# Patient Record
Sex: Female | Born: 1950
Health system: Southern US, Community
[De-identification: ages and names within clinical notes are randomized; demographics above are authoritative.]

## PROBLEM LIST (undated history)

## (undated) DIAGNOSIS — L03317 Cellulitis of buttock: Secondary | ICD-10-CM

## (undated) DIAGNOSIS — M1990 Unspecified osteoarthritis, unspecified site: Secondary | ICD-10-CM

## (undated) DIAGNOSIS — Z853 Personal history of malignant neoplasm of breast: Secondary | ICD-10-CM

## (undated) DIAGNOSIS — N3090 Cystitis, unspecified without hematuria: Secondary | ICD-10-CM

## (undated) DIAGNOSIS — Z860100 Personal history of colon polyps, unspecified: Secondary | ICD-10-CM

## (undated) DIAGNOSIS — H409 Unspecified glaucoma: Secondary | ICD-10-CM

## (undated) DIAGNOSIS — E039 Hypothyroidism, unspecified: Secondary | ICD-10-CM

## (undated) DIAGNOSIS — C50919 Malignant neoplasm of unspecified site of unspecified female breast: Secondary | ICD-10-CM

## (undated) DIAGNOSIS — L0231 Cutaneous abscess of buttock: Secondary | ICD-10-CM

## (undated) DIAGNOSIS — J45909 Unspecified asthma, uncomplicated: Secondary | ICD-10-CM

## (undated) DIAGNOSIS — E119 Type 2 diabetes mellitus without complications: Secondary | ICD-10-CM

## (undated) DIAGNOSIS — J189 Pneumonia, unspecified organism: Secondary | ICD-10-CM

## (undated) DIAGNOSIS — F319 Bipolar disorder, unspecified: Secondary | ICD-10-CM

## (undated) HISTORY — DX: Personal history of malignant neoplasm of breast: Z85.3

## (undated) HISTORY — DX: Personal history of colonic polyps: Z86.010

## (undated) HISTORY — PX: ABDOMINAL HYSTERECTOMY: SHX81

## (undated) HISTORY — DX: Unspecified asthma, uncomplicated: J45.909

## (undated) HISTORY — DX: Cystitis, unspecified without hematuria: N30.90

## (undated) HISTORY — DX: Unspecified glaucoma: H40.9

## (undated) HISTORY — DX: Bipolar disorder, unspecified: F31.9

## (undated) HISTORY — DX: Type 2 diabetes mellitus without complications: E11.9

## (undated) HISTORY — DX: Personal history of colon polyps, unspecified: Z86.0100

## (undated) HISTORY — PX: CHOLECYSTECTOMY: SHX55

## (undated) HISTORY — DX: Cellulitis of buttock: L03.317

## (undated) HISTORY — DX: Unspecified osteoarthritis, unspecified site: M19.90

## (undated) HISTORY — PX: CARDIAC CATHETERIZATION: SHX172

## (undated) HISTORY — DX: Cutaneous abscess of buttock: L02.31

## (undated) HISTORY — DX: Malignant neoplasm of unspecified site of unspecified female breast: C50.919

---

## 1990-12-02 DIAGNOSIS — F319 Bipolar disorder, unspecified: Secondary | ICD-10-CM

## 1990-12-02 HISTORY — DX: Bipolar disorder, unspecified: F31.9

## 2004-10-31 ENCOUNTER — Ambulatory Visit: Payer: Self-pay

## 2005-03-31 ENCOUNTER — Emergency Department: Payer: Self-pay | Admitting: General Practice

## 2005-07-03 ENCOUNTER — Ambulatory Visit: Payer: Self-pay | Admitting: Internal Medicine

## 2005-07-09 ENCOUNTER — Ambulatory Visit: Payer: Self-pay | Admitting: Internal Medicine

## 2005-11-26 ENCOUNTER — Other Ambulatory Visit: Payer: Self-pay

## 2005-11-26 ENCOUNTER — Inpatient Hospital Stay: Payer: Self-pay | Admitting: Psychiatry

## 2006-04-14 ENCOUNTER — Emergency Department: Payer: Self-pay | Admitting: Emergency Medicine

## 2006-07-25 ENCOUNTER — Emergency Department: Payer: Self-pay | Admitting: Emergency Medicine

## 2006-08-18 ENCOUNTER — Ambulatory Visit: Payer: Self-pay | Admitting: Psychiatry

## 2006-08-22 ENCOUNTER — Inpatient Hospital Stay: Payer: Self-pay

## 2006-08-31 ENCOUNTER — Other Ambulatory Visit: Payer: Self-pay

## 2006-09-09 ENCOUNTER — Ambulatory Visit: Payer: Self-pay

## 2006-12-22 ENCOUNTER — Ambulatory Visit: Payer: Self-pay | Admitting: Internal Medicine

## 2006-12-29 ENCOUNTER — Ambulatory Visit: Payer: Self-pay | Admitting: Internal Medicine

## 2007-08-21 ENCOUNTER — Ambulatory Visit: Payer: Self-pay | Admitting: Internal Medicine

## 2007-08-31 ENCOUNTER — Ambulatory Visit: Payer: Self-pay | Admitting: Internal Medicine

## 2008-03-02 ENCOUNTER — Ambulatory Visit: Payer: Self-pay | Admitting: Internal Medicine

## 2008-03-03 ENCOUNTER — Ambulatory Visit: Payer: Self-pay | Admitting: Internal Medicine

## 2008-12-02 DIAGNOSIS — C50919 Malignant neoplasm of unspecified site of unspecified female breast: Secondary | ICD-10-CM

## 2008-12-02 HISTORY — DX: Malignant neoplasm of unspecified site of unspecified female breast: C50.919

## 2008-12-02 HISTORY — PX: BREAST BIOPSY: SHX20

## 2008-12-02 HISTORY — PX: MASTECTOMY: SHX3

## 2009-04-13 ENCOUNTER — Ambulatory Visit: Payer: Self-pay | Admitting: Internal Medicine

## 2009-04-24 ENCOUNTER — Ambulatory Visit: Payer: Self-pay | Admitting: Internal Medicine

## 2009-05-19 ENCOUNTER — Ambulatory Visit: Payer: Self-pay | Admitting: General Surgery

## 2009-05-23 ENCOUNTER — Ambulatory Visit: Payer: Self-pay | Admitting: General Surgery

## 2010-04-16 ENCOUNTER — Ambulatory Visit: Payer: Self-pay | Admitting: General Surgery

## 2011-04-18 ENCOUNTER — Ambulatory Visit: Payer: Self-pay | Admitting: General Surgery

## 2011-05-07 ENCOUNTER — Ambulatory Visit: Payer: Self-pay | Admitting: Anesthesiology

## 2011-05-09 ENCOUNTER — Ambulatory Visit: Payer: Self-pay | Admitting: General Surgery

## 2011-11-28 ENCOUNTER — Emergency Department: Payer: Self-pay | Admitting: Emergency Medicine

## 2011-12-03 DIAGNOSIS — L03317 Cellulitis of buttock: Secondary | ICD-10-CM

## 2011-12-03 DIAGNOSIS — L0231 Cutaneous abscess of buttock: Secondary | ICD-10-CM

## 2011-12-03 HISTORY — DX: Cellulitis of buttock: L03.317

## 2011-12-03 HISTORY — DX: Cutaneous abscess of buttock: L02.31

## 2011-12-03 HISTORY — PX: UPPER GASTROINTESTINAL ENDOSCOPY: SHX188

## 2011-12-03 HISTORY — PX: COLONOSCOPY: SHX174

## 2012-04-20 ENCOUNTER — Ambulatory Visit: Payer: Self-pay | Admitting: General Surgery

## 2012-05-13 ENCOUNTER — Ambulatory Visit: Payer: Self-pay | Admitting: General Surgery

## 2012-05-14 LAB — PATHOLOGY REPORT

## 2013-02-11 ENCOUNTER — Encounter: Payer: Self-pay | Admitting: *Deleted

## 2013-03-10 ENCOUNTER — Encounter: Payer: Self-pay | Admitting: General Surgery

## 2013-04-21 ENCOUNTER — Ambulatory Visit: Payer: Self-pay | Admitting: General Surgery

## 2013-04-21 ENCOUNTER — Encounter: Payer: Self-pay | Admitting: General Surgery

## 2013-05-06 ENCOUNTER — Encounter: Payer: Self-pay | Admitting: General Surgery

## 2013-05-06 ENCOUNTER — Ambulatory Visit (INDEPENDENT_AMBULATORY_CARE_PROVIDER_SITE_OTHER): Payer: Medicare PPO | Admitting: General Surgery

## 2013-05-06 VITALS — BP 120/68 | HR 76 | Resp 14 | Ht 67.0 in | Wt 145.0 lb

## 2013-05-06 DIAGNOSIS — Z853 Personal history of malignant neoplasm of breast: Secondary | ICD-10-CM

## 2013-05-06 NOTE — Patient Instructions (Addendum)
Return in one year . Contine with aromasin.

## 2013-05-06 NOTE — Progress Notes (Addendum)
Patient ID: Jasmine Velasquez, female   DOB: 03-12-1951, 62 y.o.   MRN: [id number]  Chief Complaint  Patient presents with  . Other    mammogram    HPI Jasmine Velasquez is a 62 y.o. female here today for her follow up mammogram done on 04/21/13. Patient dose perform self breast checks and gets regular mammograms.No family history of breast cancer.Patient had a left breast mastectomy in 2010. She is on Aromasin. HPI  Past Medical History  Diagnosis Date  . Cellulitis and abscess of buttock 2013  . Glaucoma     right eye  . Breast CA 2010  . Bipolar 1 disorder 1992  . Asthma   . Arthritis   . Diabetes     non insulin  . Bladder infection   . Personal history of colonic polyps   . Personal history of malignant neoplasm of breast     Past Surgical History  Procedure Laterality Date  . Abdominal hysterectomy  age 28  . Mastectomy Left 2010  . Cholecystectomy    . Colonoscopy  2013  . Upper gastrointestinal endoscopy  2013  . Cardiac catheterization      History reviewed. No pertinent family history.  Social History History  Substance Use Topics  . Smoking status: Current Every Day Smoker -- 0.50 packs/day for 16 years    Types: Cigarettes  . Smokeless tobacco: Never Used  . Alcohol Use: No    Allergies  Allergen Reactions  . Penicillins Rash    Current Outpatient Prescriptions  Medication Sig Dispense Refill  . exemestane (AROMASIN) 25 MG tablet Take 25 mg by mouth daily after breakfast.      . glimepiride (AMARYL) 4 MG tablet Take 4 mg by mouth 3 x daily with food.      . lamoTRIgine (LAMICTAL) 100 MG tablet Take 10 mg by mouth daily.      Marland Kitchen latanoprost (XALATAN) 0.005 % ophthalmic solution       . levothyroxine (SYNTHROID, LEVOTHROID) 100 MCG tablet Take 100 tablets by mouth daily.      . metFORMIN (GLUCOPHAGE) 1000 MG tablet Take 1,000 mg by mouth daily.      . traZODone (DESYREL) 100 MG tablet Take 100 mg by mouth daily.       No current facility-administered  medications for this visit.    Review of Systems Review of Systems  Constitutional: Negative.   Respiratory: Positive for chest tightness. Negative for apnea, cough, choking, shortness of breath, wheezing and stridor.   Cardiovascular: Negative.  Negative for chest pain, palpitations and leg swelling.    Blood pressure 120/68, pulse 76, resp. rate 14, height 5\' 7"  (1.702 m), weight 145 lb (65.772 kg).  Physical Exam Physical Exam  Constitutional: She is oriented to person, place, and time. She appears well-developed and well-nourished.  Eyes: Conjunctivae are normal. No scleral icterus.  Neck: Neck supple.  Cardiovascular: Normal rate, regular rhythm and normal heart sounds.   Pulses:      Dorsalis pedis pulses are 2+ on the right side, and 2+ on the left side.       Posterior tibial pulses are 2+ on the right side, and 2+ on the left side.  No edema or VV  Pulmonary/Chest: Breath sounds normal. Right breast exhibits no inverted nipple, no mass, no nipple discharge, no skin change and no tenderness. Left breast exhibits no inverted nipple, no mass, no nipple discharge, no skin change and no tenderness.  Left mastectomy  well healed  No sign of local recurrentes.  Abdominal: Soft. Bowel sounds are normal. There is no hepatomegaly. There is no tenderness. No hernia.  Lymphadenopathy:    She has no cervical adenopathy.    She has no axillary adenopathy.  Neurological: She is alert and oriented to person, place, and time.  Skin: Skin is warm and dry.    Data Reviewed Mammogram reviewed  Assessment   stable exam Plan   Patient to return in 1 yr with diag right mammogram        SANKAR,SEEPLAPUTHUR G 05/06/2013, 8:52 PM

## 2013-11-03 ENCOUNTER — Telehealth: Payer: Self-pay | Admitting: *Deleted

## 2013-11-03 NOTE — Telephone Encounter (Signed)
Pt called and stated that she had called several months ago about her bill that she received in the mail for 63.28 and was told that she could just disregard the bill and she does not remember who she talked to. Pt wants to know why she would have been told this and does she really have to pay the amount she owes. Thanks!

## 2013-11-04 NOTE — Telephone Encounter (Signed)
Please call patient back and let her know that the balance that she owes went to her deductible for that visit.  She is responsible for the balance.

## 2013-12-11 IMAGING — CR DG CHEST 2V
1 series · 2 of 2 positions shown · non-contrast
Comparison: none

REASON FOR EXAM: cough/SOB
COMMENTS:

[Series 1: w chest pa · 0.14mm/px · 2 of 2 slices shown]
[im 1/2]
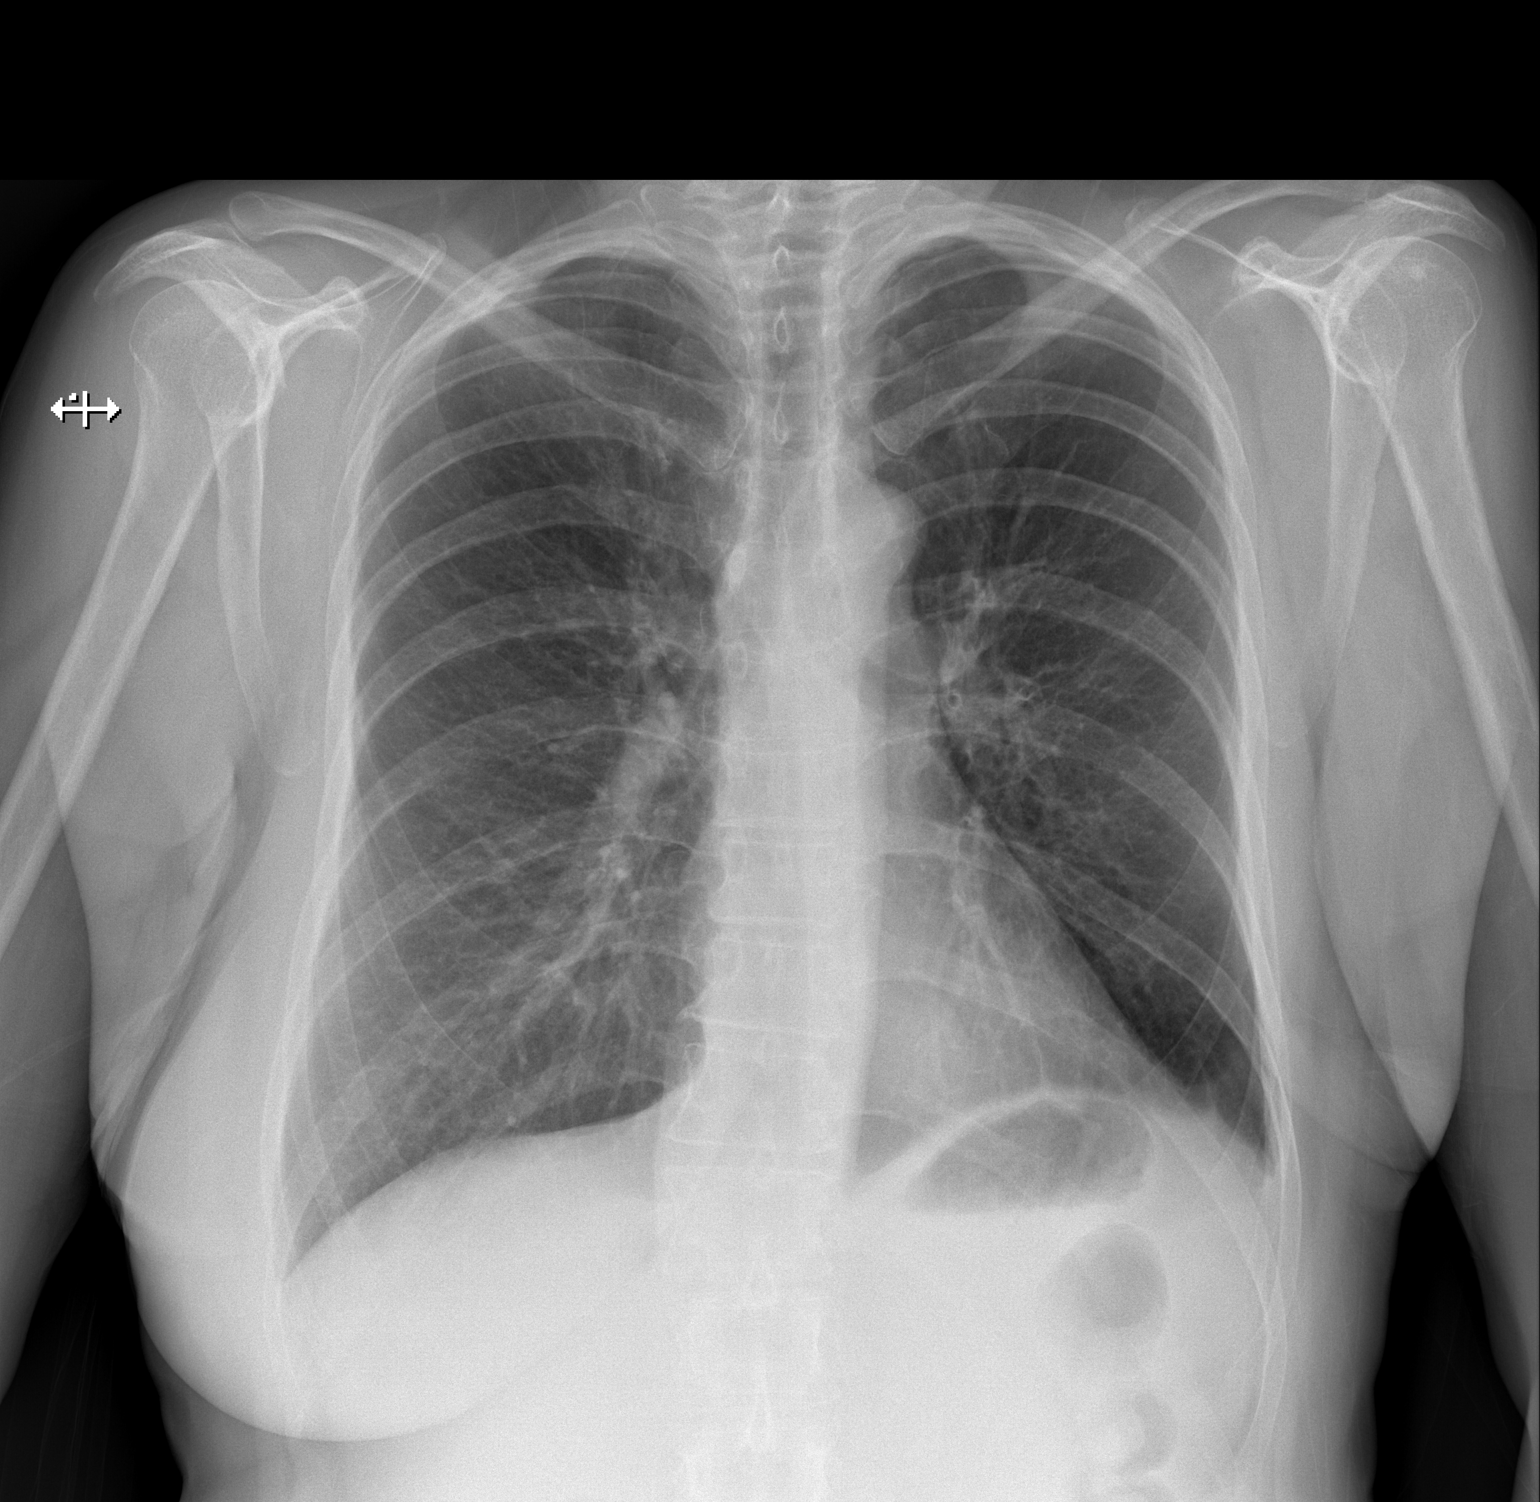
[im 2/2]
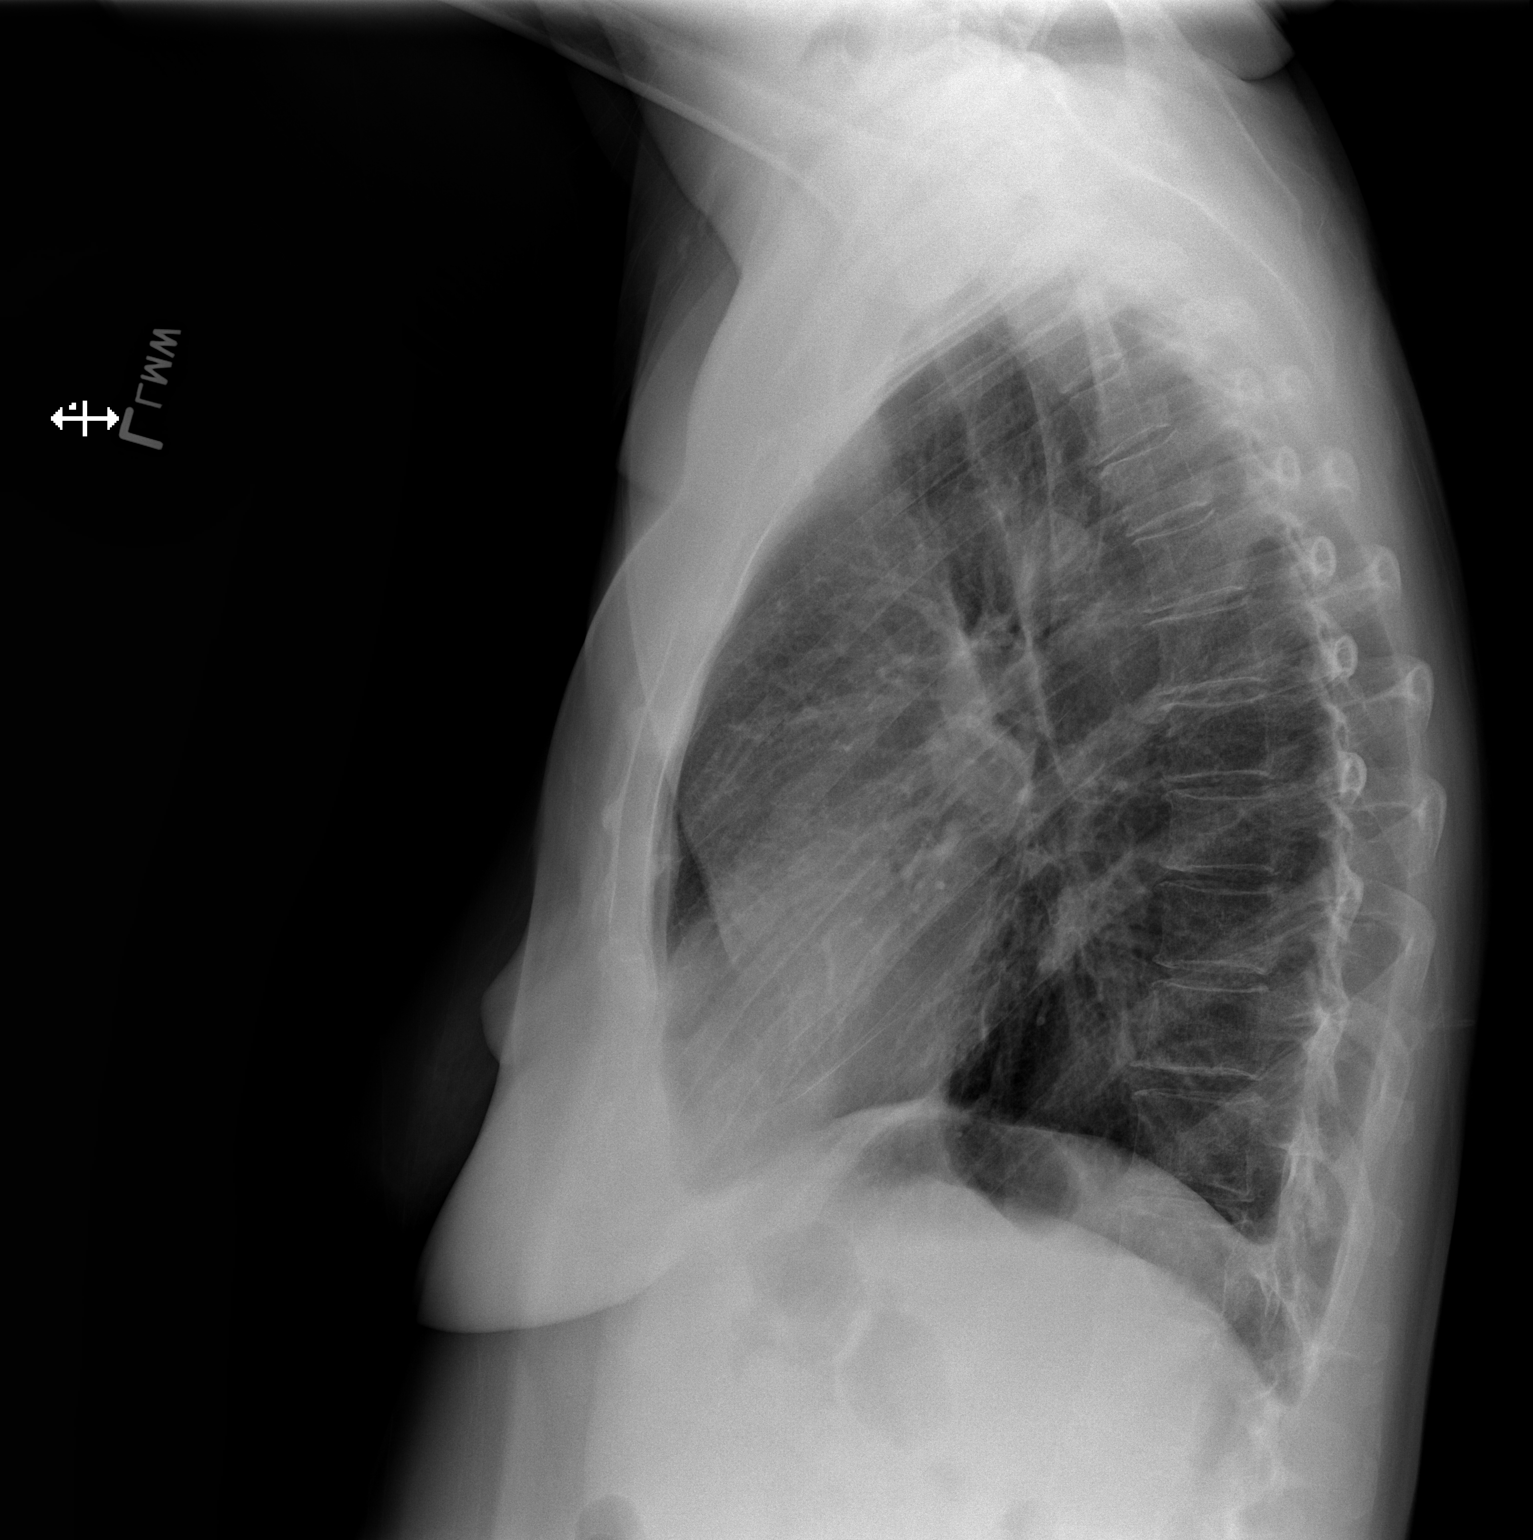

[2 of 2 positions shown; findings below may reference images not displayed]

PROCEDURE:     DXR - DXR CHEST PA (OR AP) AND LATERAL  - November 28, 2011  [DATE]

RESULT:     Comparison is made to study 19 May, 2009.

The lungs are adequately inflated. There is no focal infiltrate. The cardiac
silhouette is normal in size. The pulmonary vascularity is not engorged. I
see no pleural effusion. The mediastinum is normal in width there The bony
thorax exhibits no acute abnormality.
IMPRESSION: I do not see evidence of acute cardiopulmonary abnormality.

## 2014-04-26 ENCOUNTER — Ambulatory Visit: Payer: Self-pay | Admitting: Family Medicine

## 2014-05-16 ENCOUNTER — Ambulatory Visit: Payer: Medicare PPO | Admitting: General Surgery

## 2014-05-30 ENCOUNTER — Ambulatory Visit (INDEPENDENT_AMBULATORY_CARE_PROVIDER_SITE_OTHER): Payer: Medicare PPO | Admitting: General Surgery

## 2014-05-30 ENCOUNTER — Encounter: Payer: Self-pay | Admitting: General Surgery

## 2014-05-30 VITALS — BP 126/74 | HR 76 | Resp 12 | Ht 67.0 in | Wt 138.0 lb

## 2014-05-30 DIAGNOSIS — M533 Sacrococcygeal disorders, not elsewhere classified: Secondary | ICD-10-CM

## 2014-05-30 DIAGNOSIS — Z853 Personal history of malignant neoplasm of breast: Secondary | ICD-10-CM

## 2014-05-30 NOTE — Patient Instructions (Signed)
Patient will be asked to return to the office in one year for a right screening mammogram. Continue self breast exams. Call office for any new breast issues or concerns.

## 2014-05-30 NOTE — Progress Notes (Signed)
Patient ID: Jasmine Velasquez, female   DOB: [birth year], 63 y.o.   MRN: [id number]  Chief Complaint  Patient presents with  . Other    cyst on buttock    HPI Jasmine Velasquez is a 63 y.o. female here today for a cyst on buttocks. Patient states she had one drained in 2013. She states she noticed this about four months ago. She thinks the area is trying to drain some but has not seen any. Painful to sit. .Patient aslo beginning seen today for her follow up right breast mammogram. Patient had a left mastectomy in 2010.  HPI  Past Medical History  Diagnosis Date  . Cellulitis and abscess of buttock 2013  . Glaucoma     right eye  . Breast CA 2010  . Bipolar 1 disorder 1992  . Asthma   . Arthritis   . Diabetes     non insulin  . Bladder infection   . Personal history of colonic polyps   . Personal history of malignant neoplasm of breast     Past Surgical History  Procedure Laterality Date  . Abdominal hysterectomy  age 4  . Mastectomy Left 2010  . Cholecystectomy    . Colonoscopy  2013  . Upper gastrointestinal endoscopy  2013  . Cardiac catheterization      No family history on file.  Social History History  Substance Use Topics  . Smoking status: Current Every Day Smoker -- 0.50 packs/day for 16 years    Types: Cigarettes  . Smokeless tobacco: Never Used  . Alcohol Use: No    Allergies  Allergen Reactions  . Penicillins Rash    Current Outpatient Prescriptions  Medication Sig Dispense Refill  . exemestane (AROMASIN) 25 MG tablet Take 25 mg by mouth daily after breakfast.      . glimepiride (AMARYL) 4 MG tablet Take 4 mg by mouth 3 x daily with food.      Marland Kitchen JANUVIA 50 MG tablet Take 50 mg by mouth daily.       Marland Kitchen latanoprost (XALATAN) 0.005 % ophthalmic solution       . levothyroxine (SYNTHROID, LEVOTHROID) 100 MCG tablet Take 100 tablets by mouth daily.      . metFORMIN (GLUCOPHAGE) 1000 MG tablet Take 1,000 mg by mouth daily.      . traZODone (DESYREL) 100 MG  tablet Take 100 mg by mouth daily.       No current facility-administered medications for this visit.    Review of Systems Review of Systems  Constitutional: Negative.   Respiratory: Negative.   Cardiovascular: Negative.     Blood pressure 126/74, pulse 76, resp. rate 12, height 5\' 7"  (1.702 m), weight 138 lb (62.596 kg).  Physical Exam Physical Exam  Constitutional: She is oriented to person, place, and time. She appears well-developed and well-nourished.  Eyes: Conjunctivae are normal.  Cardiovascular: Normal rate and normal heart sounds.   Pulmonary/Chest: Effort normal and breath sounds normal. Right breast exhibits no inverted nipple, no mass, no nipple discharge, no skin change and no tenderness.  Left mastectomy site well healed.No evidence of local recurrence  Abdominal: Soft. Bowel sounds are normal. There is no tenderness.  Lymphadenopathy:    She has no cervical adenopathy.    She has no axillary adenopathy.  Neurological: She is alert and oriented to person, place, and time.  Skin: Skin is warm and dry.  Tip of coccyx is very prominent and tender, deviated to right side. No skin  abnmormality noted  Data Reviewed Mammogram reviewed   Assessment    Multifocal left breast CA, Stage 1, s/p left mastectomy. Currently on aromasin. Her current symptom of pain in rectal area is from prominent coccyx, whic is deviated to right side Plan    Patient will be asked to return to the office in one year with a right screening mammogram. Recommned use of foam padding when sitting to leesen pain from coccyx.       Gaspar Cola 05/30/2014, 1:43 PM

## 2014-06-01 ENCOUNTER — Ambulatory Visit: Payer: Medicare PPO | Admitting: General Surgery

## 2014-10-03 ENCOUNTER — Encounter: Payer: Self-pay | Admitting: General Surgery

## 2015-04-06 ENCOUNTER — Other Ambulatory Visit: Payer: Self-pay | Admitting: Family Medicine

## 2015-04-06 DIAGNOSIS — Z1231 Encounter for screening mammogram for malignant neoplasm of breast: Secondary | ICD-10-CM

## 2015-04-28 ENCOUNTER — Other Ambulatory Visit: Payer: Self-pay | Admitting: Family Medicine

## 2015-04-28 ENCOUNTER — Ambulatory Visit
Admission: RE | Admit: 2015-04-28 | Discharge: 2015-04-28 | Disposition: A | Discharge status: Home / Self Care | Payer: Medicare PPO | Source: Ambulatory Visit | Attending: Family Medicine | Admitting: Family Medicine

## 2015-04-28 DIAGNOSIS — Z1231 Encounter for screening mammogram for malignant neoplasm of breast: Secondary | ICD-10-CM | POA: Insufficient documentation

## 2015-05-11 ENCOUNTER — Ambulatory Visit: Payer: Medicare PPO | Admitting: General Surgery

## 2015-07-04 ENCOUNTER — Encounter: Payer: Self-pay | Admitting: *Deleted

## 2016-04-18 ENCOUNTER — Other Ambulatory Visit: Payer: Self-pay | Admitting: Family Medicine

## 2016-04-18 DIAGNOSIS — Z1231 Encounter for screening mammogram for malignant neoplasm of breast: Secondary | ICD-10-CM

## 2016-04-30 ENCOUNTER — Other Ambulatory Visit: Payer: Self-pay | Admitting: Family Medicine

## 2016-04-30 ENCOUNTER — Ambulatory Visit
Admission: RE | Admit: 2016-04-30 | Discharge: 2016-04-30 | Disposition: A | Discharge status: Home / Self Care | Payer: Medicare PPO | Source: Ambulatory Visit | Attending: Family Medicine | Admitting: Family Medicine

## 2016-04-30 DIAGNOSIS — Z1231 Encounter for screening mammogram for malignant neoplasm of breast: Secondary | ICD-10-CM | POA: Insufficient documentation

## 2016-04-30 HISTORY — DX: Malignant neoplasm of unspecified site of unspecified female breast: C50.919

## 2017-01-21 DIAGNOSIS — E039 Hypothyroidism, unspecified: Secondary | ICD-10-CM | POA: Diagnosis not present

## 2017-03-20 DIAGNOSIS — E118 Type 2 diabetes mellitus with unspecified complications: Secondary | ICD-10-CM | POA: Diagnosis not present

## 2017-03-20 DIAGNOSIS — E1165 Type 2 diabetes mellitus with hyperglycemia: Secondary | ICD-10-CM | POA: Diagnosis not present

## 2017-03-20 DIAGNOSIS — E7800 Pure hypercholesterolemia, unspecified: Secondary | ICD-10-CM | POA: Diagnosis not present

## 2017-03-27 DIAGNOSIS — E039 Hypothyroidism, unspecified: Secondary | ICD-10-CM | POA: Diagnosis not present

## 2017-03-27 DIAGNOSIS — E1165 Type 2 diabetes mellitus with hyperglycemia: Secondary | ICD-10-CM | POA: Diagnosis not present

## 2017-03-27 DIAGNOSIS — E118 Type 2 diabetes mellitus with unspecified complications: Secondary | ICD-10-CM | POA: Diagnosis not present

## 2017-03-27 DIAGNOSIS — E7800 Pure hypercholesterolemia, unspecified: Secondary | ICD-10-CM | POA: Diagnosis not present

## 2017-03-27 DIAGNOSIS — E538 Deficiency of other specified B group vitamins: Secondary | ICD-10-CM | POA: Diagnosis not present

## 2017-06-20 DIAGNOSIS — E538 Deficiency of other specified B group vitamins: Secondary | ICD-10-CM | POA: Diagnosis not present

## 2017-06-20 DIAGNOSIS — E039 Hypothyroidism, unspecified: Secondary | ICD-10-CM | POA: Diagnosis not present

## 2017-06-20 DIAGNOSIS — E7800 Pure hypercholesterolemia, unspecified: Secondary | ICD-10-CM | POA: Diagnosis not present

## 2017-06-20 DIAGNOSIS — E1165 Type 2 diabetes mellitus with hyperglycemia: Secondary | ICD-10-CM | POA: Diagnosis not present

## 2017-06-20 DIAGNOSIS — E118 Type 2 diabetes mellitus with unspecified complications: Secondary | ICD-10-CM | POA: Diagnosis not present

## 2017-06-25 DIAGNOSIS — F17200 Nicotine dependence, unspecified, uncomplicated: Secondary | ICD-10-CM | POA: Diagnosis not present

## 2017-06-25 DIAGNOSIS — E7800 Pure hypercholesterolemia, unspecified: Secondary | ICD-10-CM | POA: Diagnosis not present

## 2017-06-25 DIAGNOSIS — E119 Type 2 diabetes mellitus without complications: Secondary | ICD-10-CM | POA: Diagnosis not present

## 2017-06-25 DIAGNOSIS — E538 Deficiency of other specified B group vitamins: Secondary | ICD-10-CM | POA: Diagnosis not present

## 2017-06-25 DIAGNOSIS — E039 Hypothyroidism, unspecified: Secondary | ICD-10-CM | POA: Diagnosis not present

## 2017-09-02 DIAGNOSIS — Z23 Encounter for immunization: Secondary | ICD-10-CM | POA: Diagnosis not present

## 2017-10-27 DIAGNOSIS — E119 Type 2 diabetes mellitus without complications: Secondary | ICD-10-CM | POA: Diagnosis not present

## 2017-10-27 DIAGNOSIS — E7800 Pure hypercholesterolemia, unspecified: Secondary | ICD-10-CM | POA: Diagnosis not present

## 2017-10-27 DIAGNOSIS — E039 Hypothyroidism, unspecified: Secondary | ICD-10-CM | POA: Diagnosis not present

## 2017-10-27 DIAGNOSIS — E538 Deficiency of other specified B group vitamins: Secondary | ICD-10-CM | POA: Diagnosis not present

## 2017-10-30 DIAGNOSIS — E538 Deficiency of other specified B group vitamins: Secondary | ICD-10-CM | POA: Diagnosis not present

## 2017-10-30 DIAGNOSIS — E039 Hypothyroidism, unspecified: Secondary | ICD-10-CM | POA: Diagnosis not present

## 2017-10-30 DIAGNOSIS — E119 Type 2 diabetes mellitus without complications: Secondary | ICD-10-CM | POA: Diagnosis not present

## 2017-10-30 DIAGNOSIS — E7800 Pure hypercholesterolemia, unspecified: Secondary | ICD-10-CM | POA: Diagnosis not present

## 2017-10-30 DIAGNOSIS — M545 Low back pain: Secondary | ICD-10-CM | POA: Diagnosis not present

## 2017-10-30 DIAGNOSIS — F17200 Nicotine dependence, unspecified, uncomplicated: Secondary | ICD-10-CM | POA: Diagnosis not present

## 2017-12-03 DIAGNOSIS — Z86010 Personal history of colonic polyps: Secondary | ICD-10-CM | POA: Diagnosis not present

## 2017-12-03 DIAGNOSIS — E538 Deficiency of other specified B group vitamins: Secondary | ICD-10-CM | POA: Diagnosis not present

## 2017-12-03 DIAGNOSIS — E119 Type 2 diabetes mellitus without complications: Secondary | ICD-10-CM | POA: Diagnosis not present

## 2017-12-03 DIAGNOSIS — E039 Hypothyroidism, unspecified: Secondary | ICD-10-CM | POA: Diagnosis not present

## 2017-12-03 DIAGNOSIS — Z0000 Encounter for general adult medical examination without abnormal findings: Secondary | ICD-10-CM | POA: Diagnosis not present

## 2017-12-03 DIAGNOSIS — E7800 Pure hypercholesterolemia, unspecified: Secondary | ICD-10-CM | POA: Diagnosis not present

## 2017-12-05 ENCOUNTER — Other Ambulatory Visit: Payer: Self-pay | Admitting: Family Medicine

## 2017-12-05 DIAGNOSIS — Z1231 Encounter for screening mammogram for malignant neoplasm of breast: Secondary | ICD-10-CM

## 2018-01-06 ENCOUNTER — Ambulatory Visit
Admission: RE | Admit: 2018-01-06 | Discharge: 2018-01-06 | Disposition: A | Discharge status: Home / Self Care | Payer: Medicare HMO | Source: Ambulatory Visit | Attending: Family Medicine | Admitting: Family Medicine

## 2018-01-06 DIAGNOSIS — Z1231 Encounter for screening mammogram for malignant neoplasm of breast: Secondary | ICD-10-CM | POA: Diagnosis not present

## 2018-03-26 DIAGNOSIS — E538 Deficiency of other specified B group vitamins: Secondary | ICD-10-CM | POA: Diagnosis not present

## 2018-03-26 DIAGNOSIS — E7800 Pure hypercholesterolemia, unspecified: Secondary | ICD-10-CM | POA: Diagnosis not present

## 2018-03-26 DIAGNOSIS — E039 Hypothyroidism, unspecified: Secondary | ICD-10-CM | POA: Diagnosis not present

## 2018-03-26 DIAGNOSIS — E119 Type 2 diabetes mellitus without complications: Secondary | ICD-10-CM | POA: Diagnosis not present

## 2018-04-02 DIAGNOSIS — E119 Type 2 diabetes mellitus without complications: Secondary | ICD-10-CM | POA: Diagnosis not present

## 2018-04-02 DIAGNOSIS — E538 Deficiency of other specified B group vitamins: Secondary | ICD-10-CM | POA: Diagnosis not present

## 2018-04-02 DIAGNOSIS — E7800 Pure hypercholesterolemia, unspecified: Secondary | ICD-10-CM | POA: Diagnosis not present

## 2018-04-02 DIAGNOSIS — E039 Hypothyroidism, unspecified: Secondary | ICD-10-CM | POA: Diagnosis not present

## 2018-04-02 DIAGNOSIS — F17200 Nicotine dependence, unspecified, uncomplicated: Secondary | ICD-10-CM | POA: Diagnosis not present

## 2018-04-30 DIAGNOSIS — E538 Deficiency of other specified B group vitamins: Secondary | ICD-10-CM | POA: Diagnosis not present

## 2018-04-30 DIAGNOSIS — Z794 Long term (current) use of insulin: Secondary | ICD-10-CM | POA: Diagnosis not present

## 2018-04-30 DIAGNOSIS — E7800 Pure hypercholesterolemia, unspecified: Secondary | ICD-10-CM | POA: Diagnosis not present

## 2018-04-30 DIAGNOSIS — E039 Hypothyroidism, unspecified: Secondary | ICD-10-CM | POA: Diagnosis not present

## 2018-04-30 DIAGNOSIS — E119 Type 2 diabetes mellitus without complications: Secondary | ICD-10-CM | POA: Diagnosis not present

## 2018-05-13 DIAGNOSIS — E1165 Type 2 diabetes mellitus with hyperglycemia: Secondary | ICD-10-CM

## 2018-08-24 DIAGNOSIS — E119 Type 2 diabetes mellitus without complications: Secondary | ICD-10-CM | POA: Diagnosis not present

## 2018-08-24 DIAGNOSIS — Z794 Long term (current) use of insulin: Secondary | ICD-10-CM | POA: Diagnosis not present

## 2018-08-24 DIAGNOSIS — E039 Hypothyroidism, unspecified: Secondary | ICD-10-CM | POA: Diagnosis not present

## 2018-08-24 DIAGNOSIS — E538 Deficiency of other specified B group vitamins: Secondary | ICD-10-CM | POA: Diagnosis not present

## 2018-08-24 DIAGNOSIS — E7800 Pure hypercholesterolemia, unspecified: Secondary | ICD-10-CM | POA: Diagnosis not present

## 2018-09-15 DIAGNOSIS — E1169 Type 2 diabetes mellitus with other specified complication: Secondary | ICD-10-CM | POA: Diagnosis not present

## 2018-09-15 DIAGNOSIS — F330 Major depressive disorder, recurrent, mild: Secondary | ICD-10-CM | POA: Diagnosis not present

## 2018-09-15 DIAGNOSIS — F17200 Nicotine dependence, unspecified, uncomplicated: Secondary | ICD-10-CM | POA: Diagnosis not present

## 2018-09-15 DIAGNOSIS — E7800 Pure hypercholesterolemia, unspecified: Secondary | ICD-10-CM | POA: Diagnosis not present

## 2018-09-15 DIAGNOSIS — E119 Type 2 diabetes mellitus without complications: Secondary | ICD-10-CM | POA: Diagnosis not present

## 2018-09-15 DIAGNOSIS — E1165 Type 2 diabetes mellitus with hyperglycemia: Secondary | ICD-10-CM | POA: Diagnosis not present

## 2018-09-15 DIAGNOSIS — E538 Deficiency of other specified B group vitamins: Secondary | ICD-10-CM | POA: Diagnosis not present

## 2018-09-15 DIAGNOSIS — Z23 Encounter for immunization: Secondary | ICD-10-CM | POA: Diagnosis not present

## 2018-09-15 DIAGNOSIS — E039 Hypothyroidism, unspecified: Secondary | ICD-10-CM | POA: Diagnosis not present

## 2019-01-12 DIAGNOSIS — Z794 Long term (current) use of insulin: Secondary | ICD-10-CM | POA: Diagnosis not present

## 2019-01-12 DIAGNOSIS — E7800 Pure hypercholesterolemia, unspecified: Secondary | ICD-10-CM | POA: Diagnosis not present

## 2019-01-12 DIAGNOSIS — E119 Type 2 diabetes mellitus without complications: Secondary | ICD-10-CM | POA: Diagnosis not present

## 2019-01-12 DIAGNOSIS — E039 Hypothyroidism, unspecified: Secondary | ICD-10-CM | POA: Diagnosis not present

## 2019-01-18 DIAGNOSIS — F17200 Nicotine dependence, unspecified, uncomplicated: Secondary | ICD-10-CM | POA: Diagnosis not present

## 2019-01-18 DIAGNOSIS — Z794 Long term (current) use of insulin: Secondary | ICD-10-CM | POA: Diagnosis not present

## 2019-01-18 DIAGNOSIS — E1169 Type 2 diabetes mellitus with other specified complication: Secondary | ICD-10-CM | POA: Diagnosis not present

## 2019-01-18 DIAGNOSIS — Z0000 Encounter for general adult medical examination without abnormal findings: Secondary | ICD-10-CM | POA: Diagnosis not present

## 2019-01-18 DIAGNOSIS — E785 Hyperlipidemia, unspecified: Secondary | ICD-10-CM | POA: Diagnosis not present

## 2019-01-18 DIAGNOSIS — E039 Hypothyroidism, unspecified: Secondary | ICD-10-CM | POA: Diagnosis not present

## 2019-01-18 DIAGNOSIS — R55 Syncope and collapse: Secondary | ICD-10-CM | POA: Diagnosis not present

## 2019-01-18 DIAGNOSIS — E7800 Pure hypercholesterolemia, unspecified: Secondary | ICD-10-CM | POA: Diagnosis not present

## 2019-01-18 DIAGNOSIS — E119 Type 2 diabetes mellitus without complications: Secondary | ICD-10-CM | POA: Diagnosis not present

## 2019-01-22 ENCOUNTER — Other Ambulatory Visit: Payer: Self-pay | Admitting: Family Medicine

## 2019-01-22 ENCOUNTER — Telehealth (HOSPITAL_COMMUNITY): Payer: Self-pay

## 2019-01-22 ENCOUNTER — Other Ambulatory Visit (HOSPITAL_COMMUNITY): Payer: Self-pay | Admitting: Family Medicine

## 2019-01-22 DIAGNOSIS — R55 Syncope and collapse: Secondary | ICD-10-CM

## 2019-02-15 DIAGNOSIS — R55 Syncope and collapse: Secondary | ICD-10-CM | POA: Diagnosis not present

## 2019-08-24 DIAGNOSIS — E7800 Pure hypercholesterolemia, unspecified: Secondary | ICD-10-CM | POA: Diagnosis not present

## 2019-08-24 DIAGNOSIS — E039 Hypothyroidism, unspecified: Secondary | ICD-10-CM | POA: Diagnosis not present

## 2019-08-24 DIAGNOSIS — F17200 Nicotine dependence, unspecified, uncomplicated: Secondary | ICD-10-CM | POA: Diagnosis not present

## 2019-08-24 DIAGNOSIS — E538 Deficiency of other specified B group vitamins: Secondary | ICD-10-CM | POA: Diagnosis not present

## 2019-08-31 DIAGNOSIS — E039 Hypothyroidism, unspecified: Secondary | ICD-10-CM | POA: Diagnosis not present

## 2019-08-31 DIAGNOSIS — F17210 Nicotine dependence, cigarettes, uncomplicated: Secondary | ICD-10-CM | POA: Diagnosis not present

## 2019-08-31 DIAGNOSIS — Z794 Long term (current) use of insulin: Secondary | ICD-10-CM | POA: Diagnosis not present

## 2019-08-31 DIAGNOSIS — F329 Major depressive disorder, single episode, unspecified: Secondary | ICD-10-CM | POA: Diagnosis not present

## 2019-08-31 DIAGNOSIS — E7800 Pure hypercholesterolemia, unspecified: Secondary | ICD-10-CM | POA: Diagnosis not present

## 2019-08-31 DIAGNOSIS — E1165 Type 2 diabetes mellitus with hyperglycemia: Secondary | ICD-10-CM | POA: Diagnosis not present

## 2019-08-31 DIAGNOSIS — E538 Deficiency of other specified B group vitamins: Secondary | ICD-10-CM | POA: Diagnosis not present

## 2019-09-25 DIAGNOSIS — Z23 Encounter for immunization: Secondary | ICD-10-CM | POA: Diagnosis not present

## 2019-12-23 DIAGNOSIS — E1169 Type 2 diabetes mellitus with other specified complication: Secondary | ICD-10-CM | POA: Diagnosis not present

## 2019-12-23 DIAGNOSIS — E785 Hyperlipidemia, unspecified: Secondary | ICD-10-CM | POA: Diagnosis not present

## 2019-12-23 DIAGNOSIS — E538 Deficiency of other specified B group vitamins: Secondary | ICD-10-CM | POA: Diagnosis not present

## 2019-12-23 DIAGNOSIS — E7800 Pure hypercholesterolemia, unspecified: Secondary | ICD-10-CM | POA: Diagnosis not present

## 2019-12-23 DIAGNOSIS — E039 Hypothyroidism, unspecified: Secondary | ICD-10-CM | POA: Diagnosis not present

## 2020-01-04 DIAGNOSIS — M549 Dorsalgia, unspecified: Secondary | ICD-10-CM | POA: Diagnosis not present

## 2020-01-04 DIAGNOSIS — E538 Deficiency of other specified B group vitamins: Secondary | ICD-10-CM | POA: Diagnosis not present

## 2020-01-04 DIAGNOSIS — E119 Type 2 diabetes mellitus without complications: Secondary | ICD-10-CM | POA: Diagnosis not present

## 2020-01-04 DIAGNOSIS — E7800 Pure hypercholesterolemia, unspecified: Secondary | ICD-10-CM | POA: Diagnosis not present

## 2020-01-04 DIAGNOSIS — F17210 Nicotine dependence, cigarettes, uncomplicated: Secondary | ICD-10-CM | POA: Diagnosis not present

## 2020-01-04 DIAGNOSIS — E039 Hypothyroidism, unspecified: Secondary | ICD-10-CM | POA: Diagnosis not present

## 2020-01-04 DIAGNOSIS — F330 Major depressive disorder, recurrent, mild: Secondary | ICD-10-CM | POA: Diagnosis not present

## 2020-01-04 DIAGNOSIS — Z794 Long term (current) use of insulin: Secondary | ICD-10-CM | POA: Diagnosis not present

## 2020-02-17 DIAGNOSIS — E785 Hyperlipidemia, unspecified: Secondary | ICD-10-CM | POA: Diagnosis not present

## 2020-02-17 DIAGNOSIS — Z794 Long term (current) use of insulin: Secondary | ICD-10-CM | POA: Diagnosis not present

## 2020-02-17 DIAGNOSIS — E1169 Type 2 diabetes mellitus with other specified complication: Secondary | ICD-10-CM | POA: Diagnosis not present

## 2020-02-17 DIAGNOSIS — Z720 Tobacco use: Secondary | ICD-10-CM | POA: Diagnosis not present

## 2020-02-17 DIAGNOSIS — E1165 Type 2 diabetes mellitus with hyperglycemia: Secondary | ICD-10-CM | POA: Diagnosis not present

## 2020-05-04 DIAGNOSIS — E119 Type 2 diabetes mellitus without complications: Secondary | ICD-10-CM | POA: Diagnosis not present

## 2020-05-04 DIAGNOSIS — E7800 Pure hypercholesterolemia, unspecified: Secondary | ICD-10-CM | POA: Diagnosis not present

## 2020-05-04 DIAGNOSIS — Z794 Long term (current) use of insulin: Secondary | ICD-10-CM | POA: Diagnosis not present

## 2020-05-04 DIAGNOSIS — E039 Hypothyroidism, unspecified: Secondary | ICD-10-CM | POA: Diagnosis not present

## 2020-12-14 DIAGNOSIS — N898 Other specified noninflammatory disorders of vagina: Secondary | ICD-10-CM | POA: Diagnosis not present

## 2020-12-14 DIAGNOSIS — N763 Subacute and chronic vulvitis: Secondary | ICD-10-CM | POA: Diagnosis not present

## 2020-12-14 DIAGNOSIS — B373 Candidiasis of vulva and vagina: Secondary | ICD-10-CM | POA: Diagnosis not present

## 2021-01-03 DIAGNOSIS — F17200 Nicotine dependence, unspecified, uncomplicated: Secondary | ICD-10-CM | POA: Diagnosis not present

## 2021-01-03 DIAGNOSIS — E1169 Type 2 diabetes mellitus with other specified complication: Secondary | ICD-10-CM | POA: Diagnosis not present

## 2021-01-03 DIAGNOSIS — E1165 Type 2 diabetes mellitus with hyperglycemia: Secondary | ICD-10-CM | POA: Diagnosis not present

## 2021-01-03 DIAGNOSIS — E785 Hyperlipidemia, unspecified: Secondary | ICD-10-CM | POA: Diagnosis not present

## 2021-01-03 DIAGNOSIS — Z794 Long term (current) use of insulin: Secondary | ICD-10-CM | POA: Diagnosis not present

## 2021-01-03 DIAGNOSIS — F330 Major depressive disorder, recurrent, mild: Secondary | ICD-10-CM | POA: Diagnosis not present

## 2021-01-03 DIAGNOSIS — E538 Deficiency of other specified B group vitamins: Secondary | ICD-10-CM | POA: Diagnosis not present

## 2021-01-03 DIAGNOSIS — E7800 Pure hypercholesterolemia, unspecified: Secondary | ICD-10-CM | POA: Diagnosis not present

## 2021-01-03 DIAGNOSIS — E039 Hypothyroidism, unspecified: Secondary | ICD-10-CM | POA: Diagnosis not present

## 2021-03-12 ENCOUNTER — Other Ambulatory Visit: Payer: Self-pay

## 2021-03-12 ENCOUNTER — Emergency Department: Payer: Medicare HMO

## 2021-03-12 ENCOUNTER — Encounter: Payer: Self-pay | Admitting: Emergency Medicine

## 2021-03-12 ENCOUNTER — Emergency Department
Admission: EM | Admit: 2021-03-12 | Discharge: 2021-03-12 | Disposition: A | Discharge status: Home / Self Care | Payer: Medicare HMO | Attending: Emergency Medicine | Admitting: Emergency Medicine

## 2021-03-12 DIAGNOSIS — Z7984 Long term (current) use of oral hypoglycemic drugs: Secondary | ICD-10-CM | POA: Insufficient documentation

## 2021-03-12 DIAGNOSIS — M7989 Other specified soft tissue disorders: Secondary | ICD-10-CM | POA: Diagnosis not present

## 2021-03-12 DIAGNOSIS — J45909 Unspecified asthma, uncomplicated: Secondary | ICD-10-CM | POA: Diagnosis not present

## 2021-03-12 DIAGNOSIS — Z853 Personal history of malignant neoplasm of breast: Secondary | ICD-10-CM | POA: Insufficient documentation

## 2021-03-12 DIAGNOSIS — S6991XA Unspecified injury of right wrist, hand and finger(s), initial encounter: Secondary | ICD-10-CM | POA: Diagnosis present

## 2021-03-12 DIAGNOSIS — E119 Type 2 diabetes mellitus without complications: Secondary | ICD-10-CM | POA: Insufficient documentation

## 2021-03-12 DIAGNOSIS — F17210 Nicotine dependence, cigarettes, uncomplicated: Secondary | ICD-10-CM | POA: Insufficient documentation

## 2021-03-12 DIAGNOSIS — W010XXA Fall on same level from slipping, tripping and stumbling without subsequent striking against object, initial encounter: Secondary | ICD-10-CM | POA: Diagnosis not present

## 2021-03-12 DIAGNOSIS — S62656A Nondisplaced fracture of medial phalanx of right little finger, initial encounter for closed fracture: Secondary | ICD-10-CM | POA: Diagnosis not present

## 2021-03-12 DIAGNOSIS — S62626A Displaced fracture of medial phalanx of right little finger, initial encounter for closed fracture: Secondary | ICD-10-CM | POA: Diagnosis not present

## 2021-03-12 NOTE — ED Provider Notes (Signed)
[facility] Emergency Department Provider Note   ____________________________________________   Event Date/Time   First MD Initiated Contact with Patient 03/12/21 1100     (approximate)  I have reviewed the triage vital signs and the nursing notes.   HISTORY  Chief Complaint Hand Injury    HPI Jasmine Velasquez is a 70 y.o. female with past medical history of bipolar disorder, diabetes, and asthma who presents to the ED complaining of hand injury.  Patient reports that she tripped and fell yesterday evening, falling onto her outstretched right hand.  She denies hitting her head or losing consciousness, does not take any blood thinners.  She has had increasing pain and swelling to her right fifth digit of her hand.  She has a ring at the base of this finger that she has been unable to remove.  She denies significant pain at her elbow or wrist, has not had any injury to her other extremities.        Past Medical History:  Diagnosis Date  . Arthritis   . Asthma   . Bipolar 1 disorder ([place]) 1992  . Bladder infection   . Breast CA ([place]) 2010  . Breast cancer ([place]) 2010   left breast mastectomy NO chemo or rad  . Cellulitis and abscess of buttock 2013  . Diabetes ([place])    non insulin  . Glaucoma    right eye  . Personal history of colonic polyps   . Personal history of malignant neoplasm of breast     Patient Active Problem List   Diagnosis Date Noted  . Personal history of malignant neoplasm of breast 05/06/2013    Past Surgical History:  Procedure Laterality Date  . ABDOMINAL HYSTERECTOMY  age 66  . BREAST BIOPSY Right 2010   core bx- neg  . CARDIAC CATHETERIZATION    . CHOLECYSTECTOMY    . COLONOSCOPY  2013  . MASTECTOMY Left 2010  . UPPER GASTROINTESTINAL ENDOSCOPY  2013    Prior to Admission medications   Medication Sig Start Date End Date Taking? Authorizing Provider  exemestane (AROMASIN) 25 MG tablet Take 25 mg by mouth daily  after breakfast.    [provider]  glimepiride (AMARYL) 4 MG tablet Take 4 mg by mouth 3 x daily with food. 03/18/13   [provider]  JANUVIA 50 MG tablet Take 50 mg by mouth daily.  05/27/14   [provider]  latanoprost (XALATAN) 0.005 % ophthalmic solution  02/04/13   [provider]  levothyroxine (SYNTHROID, LEVOTHROID) 100 MCG tablet Take 100 tablets by mouth daily. 03/17/13   [provider]  metFORMIN (GLUCOPHAGE) 1000 MG tablet Take 1,000 mg by mouth daily. 03/18/13   [provider]  traZODone (DESYREL) 100 MG tablet Take 100 mg by mouth daily. 03/17/13   [provider]    Allergies Penicillins  Family History  Problem Relation Age of Onset  . Ovarian cancer Maternal Grandmother 42    Social History Social History   Tobacco Use  . Smoking status: Current Every Day Smoker    Packs/day: 0.50    Years: 16.00    Pack years: 8.00    Types: Cigarettes  . Smokeless tobacco: Never Used  Substance Use Topics  . Alcohol use: No  . Drug use: No    Review of Systems  Constitutional: No fever/chills Eyes: No visual changes. ENT: No sore throat. Cardiovascular: Denies chest pain. Respiratory: Denies shortness of breath. Gastrointestinal: No abdominal  pain.  No nausea, no vomiting.  No diarrhea.  No constipation. Genitourinary: Negative for dysuria. Musculoskeletal: Negative for back pain.  Positive for pain and swelling of right pinky finger. Skin: Negative for rash. Neurological: Negative for headaches, focal weakness or numbness.  ____________________________________________   PHYSICAL EXAM:  VITAL SIGNS: ED Triage Vitals [03/12/21 1050]  Enc Vitals Group     BP 121/79     Pulse Rate 87     Resp 20     Temp 98.1 F (36.7 C)     Temp Source Oral     SpO2 98 %     Weight      Height      Head Circumference      Peak Flow      Pain Score      Pain Loc      Pain Edu?      Excl. in [place]?      Constitutional: Alert and oriented. Eyes: Conjunctivae are normal. Head: Atraumatic. Nose: No congestion/rhinnorhea. Mouth/Throat: Mucous membranes are moist. Neck: Normal ROM Cardiovascular: Normal rate, regular rhythm. Grossly normal heart sounds. Respiratory: Normal respiratory effort.  No retractions. Lungs CTAB. Gastrointestinal: Soft and nontender. No distention. Genitourinary: deferred Musculoskeletal: No lower extremity tenderness nor edema.  Edema and ecchymosis to right fifth digit diffusely with associated tenderness.  Small ring in place at base of right pinky finger with significant distal edema. Neurologic:  Normal speech and language. No gross focal neurologic deficits are appreciated. Skin:  Skin is warm, dry and intact. No rash noted. Psychiatric: Mood and affect are normal. Speech and behavior are normal.  ____________________________________________   LABS (all labs ordered are listed, but only abnormal results are displayed)  Labs Reviewed - No data to display   PROCEDURES  Procedure(s) performed (including Critical Care):  Procedures   ____________________________________________   INITIAL IMPRESSION / ASSESSMENT AND PLAN / ED COURSE       70 year old female with past medical history of bipolar disorder, diabetes, and asthma who presents to the ED with pain and swelling of right pinky finger after a fall yesterday.  She did have a ring stuck in place at the base of her finger which was removed with ring cutter.  Patient had significant improvement in pain following removal of ring.  We will further assess with x-ray for any fracture or dislocation.  Hand x-rays were reviewed by me and show small nondisplaced fracture of base of fifth middle phalanx.  Her small finger was buddy taped to the ring finger and she is appropriate for discharge home with orthopedic follow-up as needed.  She was counseled to return to the ED for new worsening symptoms,  patient agrees with plan.      ____________________________________________   FINAL CLINICAL IMPRESSION(S) / ED DIAGNOSES  Final diagnoses:  Closed nondisplaced fracture of middle phalanx of right little finger, initial encounter     ED Discharge Orders    None       Note:  This document was prepared using Dragon voice recognition software and may include unintentional dictation errors.   Blake Divine, MD 03/12/21 1236

## 2021-03-12 NOTE — ED Triage Notes (Signed)
States she fell yesterday  Injury to right 5 th finger   5th finger in swollen and bruised

## 2021-03-23 DIAGNOSIS — Z794 Long term (current) use of insulin: Secondary | ICD-10-CM | POA: Diagnosis not present

## 2021-03-23 DIAGNOSIS — E119 Type 2 diabetes mellitus without complications: Secondary | ICD-10-CM | POA: Diagnosis not present

## 2021-03-23 DIAGNOSIS — F17200 Nicotine dependence, unspecified, uncomplicated: Secondary | ICD-10-CM | POA: Diagnosis not present

## 2021-03-23 DIAGNOSIS — E039 Hypothyroidism, unspecified: Secondary | ICD-10-CM | POA: Diagnosis not present

## 2021-03-23 DIAGNOSIS — E538 Deficiency of other specified B group vitamins: Secondary | ICD-10-CM | POA: Diagnosis not present

## 2021-03-23 DIAGNOSIS — F330 Major depressive disorder, recurrent, mild: Secondary | ICD-10-CM | POA: Diagnosis not present

## 2021-03-23 DIAGNOSIS — E7800 Pure hypercholesterolemia, unspecified: Secondary | ICD-10-CM | POA: Diagnosis not present

## 2021-03-23 DIAGNOSIS — F17218 Nicotine dependence, cigarettes, with other nicotine-induced disorders: Secondary | ICD-10-CM | POA: Diagnosis not present

## 2021-07-23 DIAGNOSIS — E7800 Pure hypercholesterolemia, unspecified: Secondary | ICD-10-CM | POA: Diagnosis not present

## 2021-07-23 DIAGNOSIS — E039 Hypothyroidism, unspecified: Secondary | ICD-10-CM | POA: Diagnosis not present

## 2021-07-23 DIAGNOSIS — E538 Deficiency of other specified B group vitamins: Secondary | ICD-10-CM | POA: Diagnosis not present

## 2021-07-23 DIAGNOSIS — E119 Type 2 diabetes mellitus without complications: Secondary | ICD-10-CM | POA: Diagnosis not present

## 2021-07-23 DIAGNOSIS — Z794 Long term (current) use of insulin: Secondary | ICD-10-CM | POA: Diagnosis not present

## 2021-07-25 DIAGNOSIS — E039 Hypothyroidism, unspecified: Secondary | ICD-10-CM | POA: Diagnosis not present

## 2021-07-25 DIAGNOSIS — E785 Hyperlipidemia, unspecified: Secondary | ICD-10-CM | POA: Diagnosis not present

## 2021-07-25 DIAGNOSIS — Z794 Long term (current) use of insulin: Secondary | ICD-10-CM | POA: Diagnosis not present

## 2021-07-25 DIAGNOSIS — F17200 Nicotine dependence, unspecified, uncomplicated: Secondary | ICD-10-CM | POA: Diagnosis not present

## 2021-07-25 DIAGNOSIS — Z0000 Encounter for general adult medical examination without abnormal findings: Secondary | ICD-10-CM | POA: Diagnosis not present

## 2021-07-25 DIAGNOSIS — E119 Type 2 diabetes mellitus without complications: Secondary | ICD-10-CM | POA: Diagnosis not present

## 2021-10-10 DIAGNOSIS — E7800 Pure hypercholesterolemia, unspecified: Secondary | ICD-10-CM | POA: Diagnosis not present

## 2021-10-10 DIAGNOSIS — Z794 Long term (current) use of insulin: Secondary | ICD-10-CM | POA: Diagnosis not present

## 2021-10-10 DIAGNOSIS — E119 Type 2 diabetes mellitus without complications: Secondary | ICD-10-CM | POA: Diagnosis not present

## 2021-10-10 DIAGNOSIS — E039 Hypothyroidism, unspecified: Secondary | ICD-10-CM | POA: Diagnosis not present

## 2021-10-10 DIAGNOSIS — E1169 Type 2 diabetes mellitus with other specified complication: Secondary | ICD-10-CM | POA: Diagnosis not present

## 2021-10-10 DIAGNOSIS — E785 Hyperlipidemia, unspecified: Secondary | ICD-10-CM | POA: Diagnosis not present

## 2021-10-17 DIAGNOSIS — Z23 Encounter for immunization: Secondary | ICD-10-CM | POA: Diagnosis not present

## 2021-10-17 DIAGNOSIS — E119 Type 2 diabetes mellitus without complications: Secondary | ICD-10-CM | POA: Diagnosis not present

## 2021-10-17 DIAGNOSIS — Z794 Long term (current) use of insulin: Secondary | ICD-10-CM | POA: Diagnosis not present

## 2021-10-17 DIAGNOSIS — F17200 Nicotine dependence, unspecified, uncomplicated: Secondary | ICD-10-CM | POA: Diagnosis not present

## 2021-10-17 DIAGNOSIS — E039 Hypothyroidism, unspecified: Secondary | ICD-10-CM | POA: Diagnosis not present

## 2021-10-17 DIAGNOSIS — E7800 Pure hypercholesterolemia, unspecified: Secondary | ICD-10-CM | POA: Diagnosis not present

## 2022-02-08 DIAGNOSIS — E7800 Pure hypercholesterolemia, unspecified: Secondary | ICD-10-CM | POA: Diagnosis not present

## 2022-02-08 DIAGNOSIS — E119 Type 2 diabetes mellitus without complications: Secondary | ICD-10-CM | POA: Diagnosis not present

## 2022-02-08 DIAGNOSIS — E039 Hypothyroidism, unspecified: Secondary | ICD-10-CM | POA: Diagnosis not present

## 2022-02-08 DIAGNOSIS — Z794 Long term (current) use of insulin: Secondary | ICD-10-CM | POA: Diagnosis not present

## 2022-02-12 DIAGNOSIS — E039 Hypothyroidism, unspecified: Secondary | ICD-10-CM | POA: Diagnosis not present

## 2022-02-12 DIAGNOSIS — Z87891 Personal history of nicotine dependence: Secondary | ICD-10-CM | POA: Diagnosis not present

## 2022-02-12 DIAGNOSIS — E119 Type 2 diabetes mellitus without complications: Secondary | ICD-10-CM | POA: Diagnosis not present

## 2022-02-12 DIAGNOSIS — Z794 Long term (current) use of insulin: Secondary | ICD-10-CM | POA: Diagnosis not present

## 2022-02-12 DIAGNOSIS — E7800 Pure hypercholesterolemia, unspecified: Secondary | ICD-10-CM | POA: Diagnosis not present

## 2022-07-16 DIAGNOSIS — Z794 Long term (current) use of insulin: Secondary | ICD-10-CM | POA: Diagnosis not present

## 2022-07-16 DIAGNOSIS — E039 Hypothyroidism, unspecified: Secondary | ICD-10-CM | POA: Diagnosis not present

## 2022-07-16 DIAGNOSIS — E785 Hyperlipidemia, unspecified: Secondary | ICD-10-CM | POA: Diagnosis not present

## 2022-07-16 DIAGNOSIS — E7800 Pure hypercholesterolemia, unspecified: Secondary | ICD-10-CM | POA: Diagnosis not present

## 2022-07-16 DIAGNOSIS — E1169 Type 2 diabetes mellitus with other specified complication: Secondary | ICD-10-CM | POA: Diagnosis not present

## 2022-07-16 DIAGNOSIS — E119 Type 2 diabetes mellitus without complications: Secondary | ICD-10-CM | POA: Diagnosis not present

## 2022-07-16 DIAGNOSIS — E538 Deficiency of other specified B group vitamins: Secondary | ICD-10-CM | POA: Diagnosis not present

## 2022-07-16 DIAGNOSIS — E1165 Type 2 diabetes mellitus with hyperglycemia: Secondary | ICD-10-CM | POA: Diagnosis not present

## 2022-07-29 DIAGNOSIS — E119 Type 2 diabetes mellitus without complications: Secondary | ICD-10-CM | POA: Diagnosis not present

## 2022-07-29 DIAGNOSIS — E7800 Pure hypercholesterolemia, unspecified: Secondary | ICD-10-CM | POA: Diagnosis not present

## 2022-07-29 DIAGNOSIS — Z794 Long term (current) use of insulin: Secondary | ICD-10-CM | POA: Diagnosis not present

## 2022-07-29 DIAGNOSIS — E039 Hypothyroidism, unspecified: Secondary | ICD-10-CM | POA: Diagnosis not present

## 2022-07-29 DIAGNOSIS — F330 Major depressive disorder, recurrent, mild: Secondary | ICD-10-CM | POA: Diagnosis not present

## 2022-09-12 DIAGNOSIS — E039 Hypothyroidism, unspecified: Secondary | ICD-10-CM | POA: Diagnosis not present

## 2022-09-12 DIAGNOSIS — E119 Type 2 diabetes mellitus without complications: Secondary | ICD-10-CM | POA: Diagnosis not present

## 2022-09-12 DIAGNOSIS — Z23 Encounter for immunization: Secondary | ICD-10-CM | POA: Diagnosis not present

## 2022-09-12 DIAGNOSIS — Z794 Long term (current) use of insulin: Secondary | ICD-10-CM | POA: Diagnosis not present

## 2022-09-12 DIAGNOSIS — F330 Major depressive disorder, recurrent, mild: Secondary | ICD-10-CM | POA: Diagnosis not present

## 2022-09-12 DIAGNOSIS — E7800 Pure hypercholesterolemia, unspecified: Secondary | ICD-10-CM | POA: Diagnosis not present

## 2022-10-04 DIAGNOSIS — E039 Hypothyroidism, unspecified: Secondary | ICD-10-CM | POA: Diagnosis not present

## 2022-12-03 DIAGNOSIS — E039 Hypothyroidism, unspecified: Secondary | ICD-10-CM | POA: Diagnosis not present

## 2022-12-19 DIAGNOSIS — E119 Type 2 diabetes mellitus without complications: Secondary | ICD-10-CM | POA: Diagnosis not present

## 2022-12-19 DIAGNOSIS — F330 Major depressive disorder, recurrent, mild: Secondary | ICD-10-CM | POA: Diagnosis not present

## 2022-12-19 DIAGNOSIS — Z794 Long term (current) use of insulin: Secondary | ICD-10-CM | POA: Diagnosis not present

## 2022-12-19 DIAGNOSIS — E7800 Pure hypercholesterolemia, unspecified: Secondary | ICD-10-CM | POA: Diagnosis not present

## 2022-12-27 DIAGNOSIS — E7800 Pure hypercholesterolemia, unspecified: Secondary | ICD-10-CM | POA: Diagnosis not present

## 2022-12-27 DIAGNOSIS — Z794 Long term (current) use of insulin: Secondary | ICD-10-CM | POA: Diagnosis not present

## 2022-12-27 DIAGNOSIS — E119 Type 2 diabetes mellitus without complications: Secondary | ICD-10-CM | POA: Diagnosis not present

## 2023-03-26 IMAGING — DX DG HAND 2V*R*
2 series · 2 of 2 positions shown · non-contrast
Comparison: None.

CLINICAL DATA: Recent fall with right hand pain, initial encounter

EXAM:
RIGHT HAND - 2 VIEW

[hand ap]
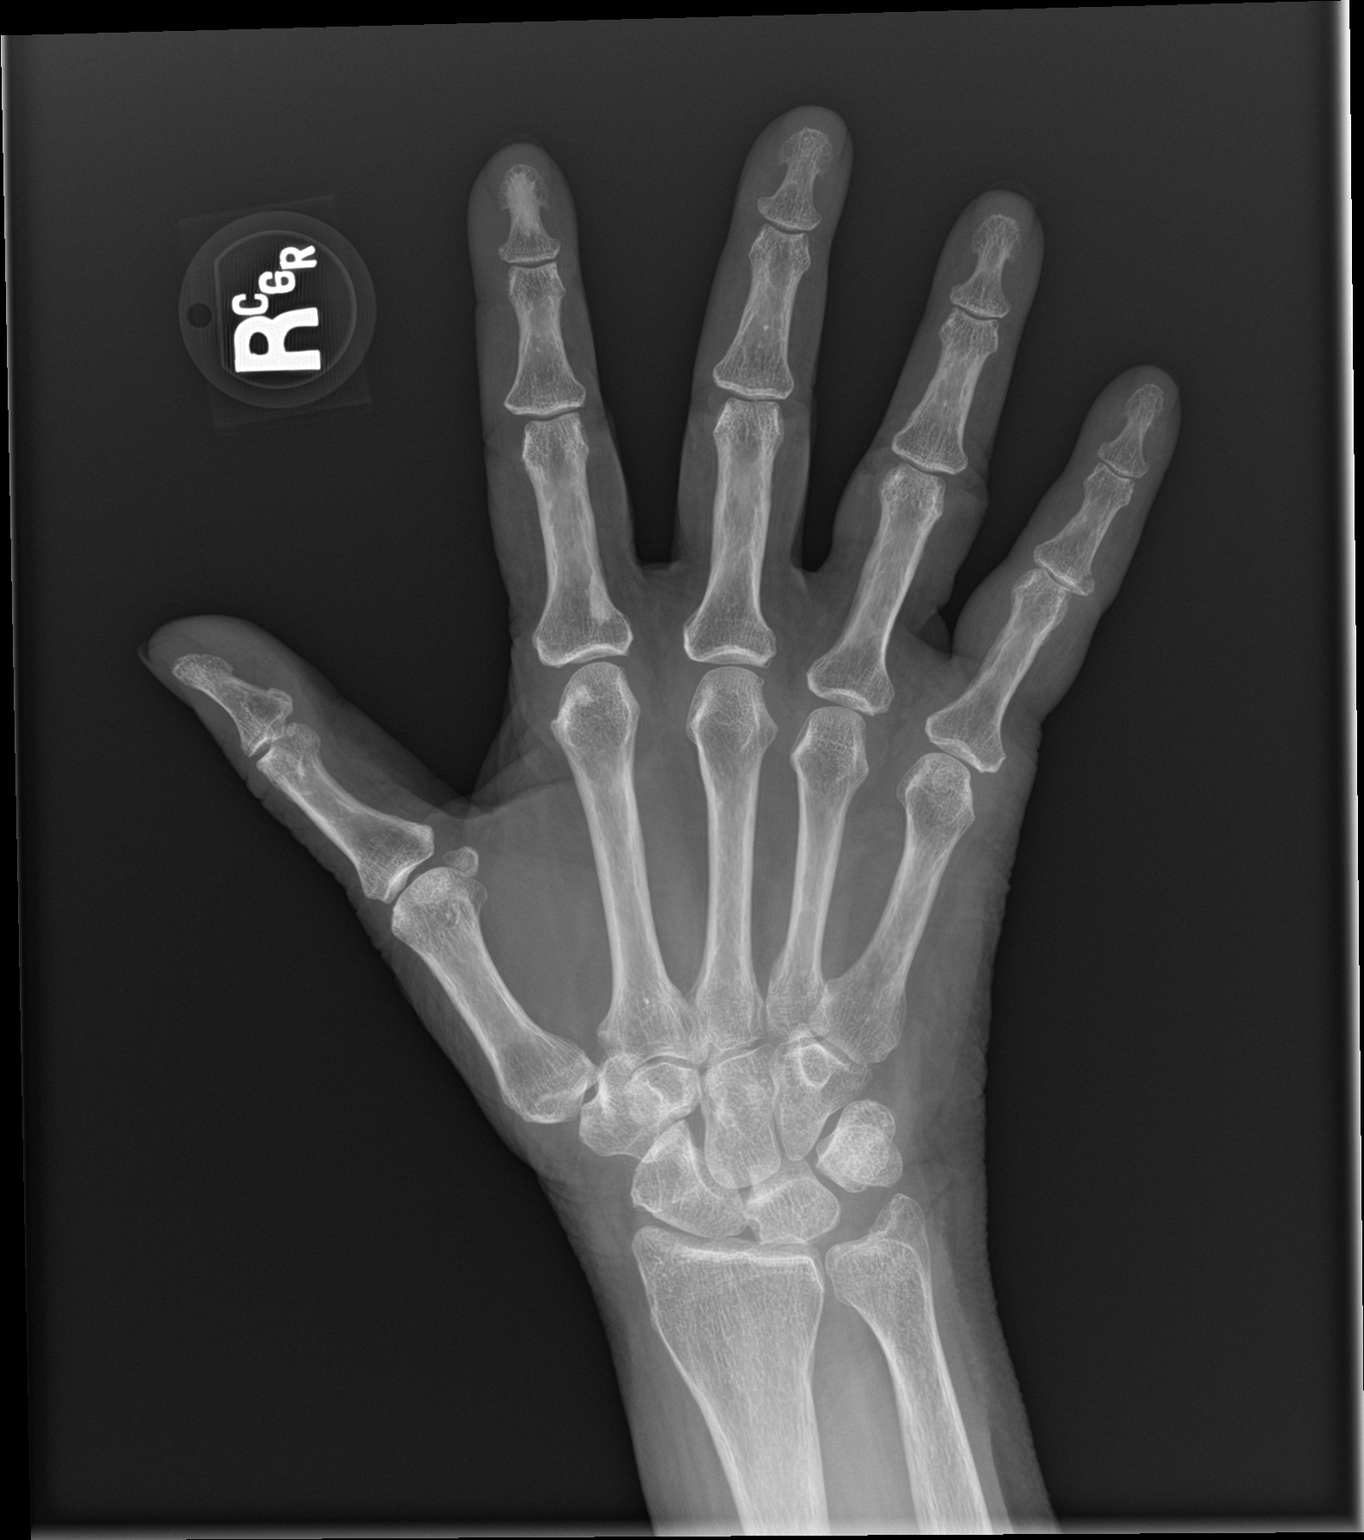

[hand lat]
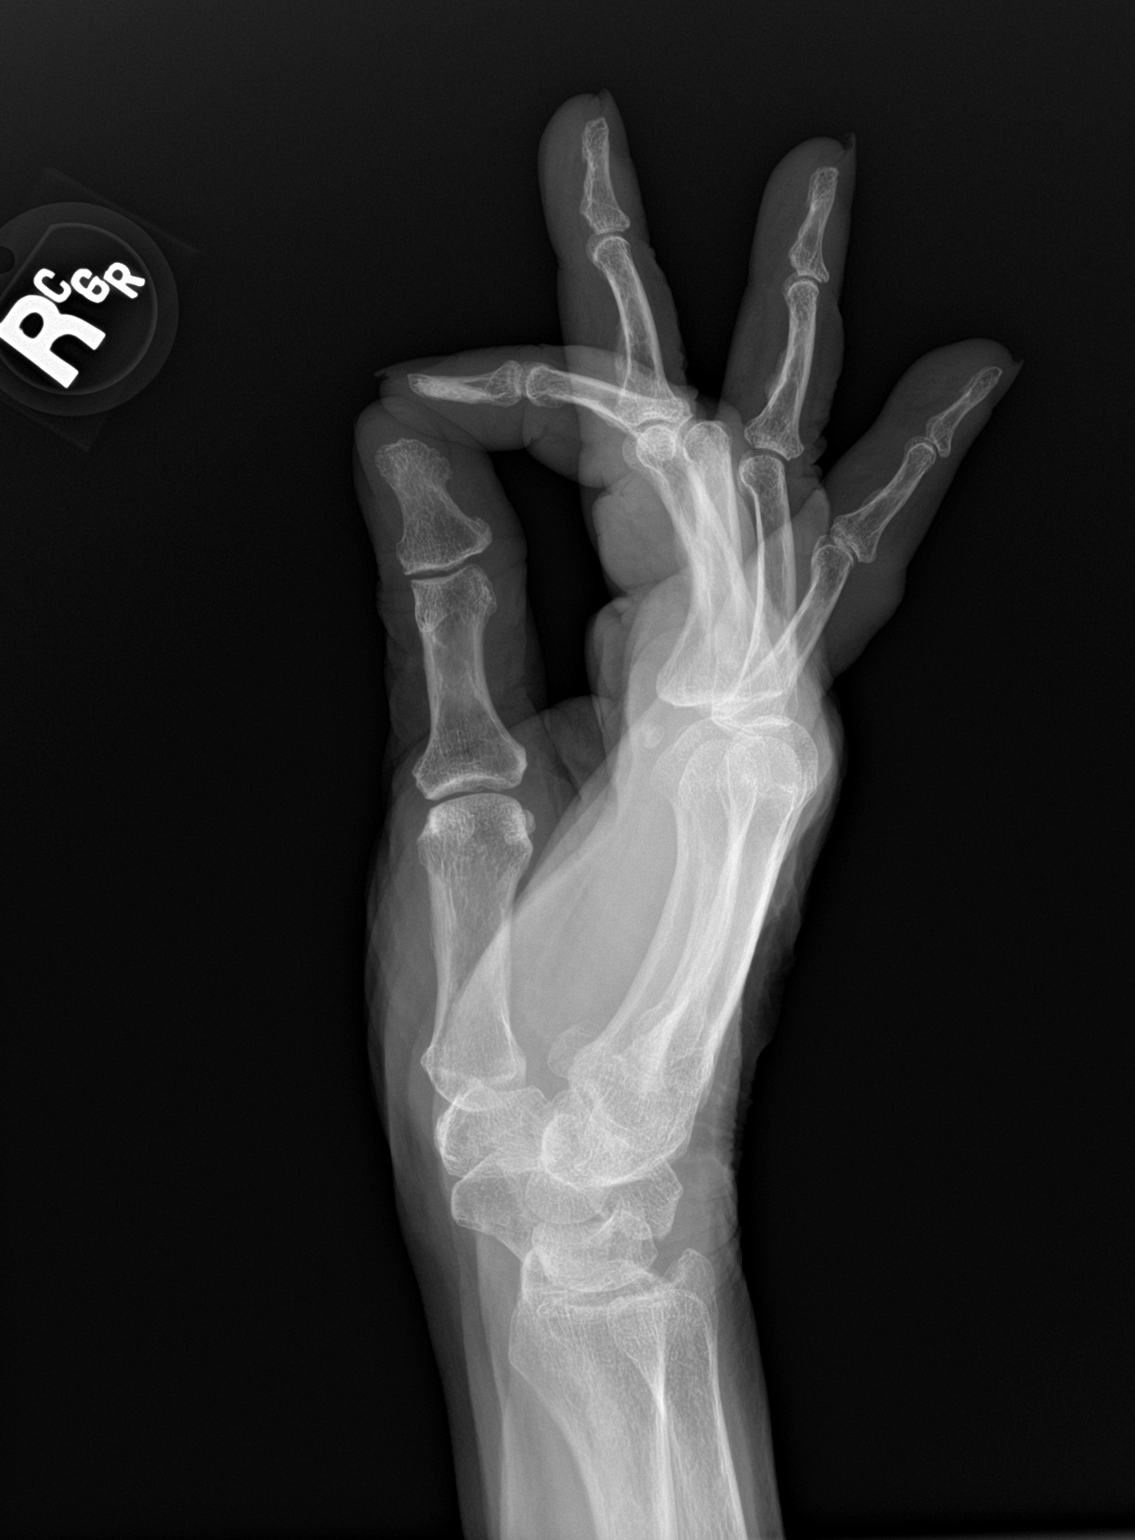

[2 of 2 positions shown; findings below may reference images not displayed]

FINDINGS: There is a fracture at the base of the fifth middle phalanx medially
with only mild displacement identified. Associated soft tissue
swelling is noted. No other fracture or dislocation is seen.
IMPRESSION: Fracture at the base of the fifth middle phalanx as described.

## 2023-04-14 ENCOUNTER — Inpatient Hospital Stay
Admission: EM | Admit: 2023-04-14 | Discharge: 2023-04-19 | DRG: 871 | Disposition: A | Discharge status: Home / Self Care | Payer: Medicare HMO | Attending: Internal Medicine | Admitting: Internal Medicine

## 2023-04-14 ENCOUNTER — Encounter: Payer: Self-pay | Admitting: Internal Medicine

## 2023-04-14 ENCOUNTER — Emergency Department: Payer: Medicare HMO

## 2023-04-14 ENCOUNTER — Other Ambulatory Visit: Payer: Self-pay

## 2023-04-14 DIAGNOSIS — I959 Hypotension, unspecified: Secondary | ICD-10-CM | POA: Diagnosis not present

## 2023-04-14 DIAGNOSIS — K429 Umbilical hernia without obstruction or gangrene: Secondary | ICD-10-CM | POA: Diagnosis not present

## 2023-04-14 DIAGNOSIS — Z1152 Encounter for screening for COVID-19: Secondary | ICD-10-CM

## 2023-04-14 DIAGNOSIS — E876 Hypokalemia: Secondary | ICD-10-CM | POA: Diagnosis not present

## 2023-04-14 DIAGNOSIS — R0602 Shortness of breath: Secondary | ICD-10-CM | POA: Diagnosis present

## 2023-04-14 DIAGNOSIS — Z794 Long term (current) use of insulin: Secondary | ICD-10-CM | POA: Diagnosis not present

## 2023-04-14 DIAGNOSIS — J189 Pneumonia, unspecified organism: Secondary | ICD-10-CM | POA: Diagnosis not present

## 2023-04-14 DIAGNOSIS — Z90710 Acquired absence of both cervix and uterus: Secondary | ICD-10-CM | POA: Diagnosis not present

## 2023-04-14 DIAGNOSIS — R6520 Severe sepsis without septic shock: Secondary | ICD-10-CM | POA: Diagnosis present

## 2023-04-14 DIAGNOSIS — E039 Hypothyroidism, unspecified: Secondary | ICD-10-CM | POA: Diagnosis not present

## 2023-04-14 DIAGNOSIS — E1165 Type 2 diabetes mellitus with hyperglycemia: Secondary | ICD-10-CM | POA: Diagnosis not present

## 2023-04-14 DIAGNOSIS — A419 Sepsis, unspecified organism: Secondary | ICD-10-CM | POA: Diagnosis not present

## 2023-04-14 DIAGNOSIS — R0689 Other abnormalities of breathing: Secondary | ICD-10-CM | POA: Diagnosis not present

## 2023-04-14 DIAGNOSIS — Z853 Personal history of malignant neoplasm of breast: Secondary | ICD-10-CM

## 2023-04-14 DIAGNOSIS — M1990 Unspecified osteoarthritis, unspecified site: Secondary | ICD-10-CM | POA: Diagnosis present

## 2023-04-14 DIAGNOSIS — Z880 Allergy status to penicillin: Secondary | ICD-10-CM | POA: Diagnosis not present

## 2023-04-14 DIAGNOSIS — Z86010 Personal history of colonic polyps: Secondary | ICD-10-CM | POA: Diagnosis not present

## 2023-04-14 DIAGNOSIS — E1110 Type 2 diabetes mellitus with ketoacidosis without coma: Secondary | ICD-10-CM | POA: Diagnosis not present

## 2023-04-14 DIAGNOSIS — R739 Hyperglycemia, unspecified: Secondary | ICD-10-CM | POA: Diagnosis not present

## 2023-04-14 DIAGNOSIS — J45909 Unspecified asthma, uncomplicated: Secondary | ICD-10-CM | POA: Diagnosis present

## 2023-04-14 DIAGNOSIS — K5900 Constipation, unspecified: Secondary | ICD-10-CM | POA: Diagnosis present

## 2023-04-14 DIAGNOSIS — R112 Nausea with vomiting, unspecified: Secondary | ICD-10-CM | POA: Diagnosis not present

## 2023-04-14 DIAGNOSIS — R000 Tachycardia, unspecified: Secondary | ICD-10-CM | POA: Diagnosis not present

## 2023-04-14 DIAGNOSIS — H409 Unspecified glaucoma: Secondary | ICD-10-CM | POA: Diagnosis present

## 2023-04-14 DIAGNOSIS — Z9012 Acquired absence of left breast and nipple: Secondary | ICD-10-CM

## 2023-04-14 DIAGNOSIS — F319 Bipolar disorder, unspecified: Secondary | ICD-10-CM | POA: Diagnosis not present

## 2023-04-14 DIAGNOSIS — K920 Hematemesis: Secondary | ICD-10-CM | POA: Diagnosis not present

## 2023-04-14 DIAGNOSIS — R4182 Altered mental status, unspecified: Secondary | ICD-10-CM | POA: Diagnosis not present

## 2023-04-14 DIAGNOSIS — E1169 Type 2 diabetes mellitus with other specified complication: Secondary | ICD-10-CM | POA: Diagnosis not present

## 2023-04-14 DIAGNOSIS — R0600 Dyspnea, unspecified: Secondary | ICD-10-CM | POA: Diagnosis not present

## 2023-04-14 HISTORY — DX: Hypothyroidism, unspecified: E03.9

## 2023-04-14 HISTORY — DX: Pneumonia, unspecified organism: J18.9

## 2023-04-14 LAB — CBC WITH DIFFERENTIAL/PLATELET
Abs Immature Granulocytes: 0.33 10*3/uL — ABNORMAL HIGH (ref 0.00–0.07)
Basophils Absolute: 0.1 10*3/uL (ref 0.0–0.1)
Basophils Relative: 1 %
Eosinophils Absolute: 0 10*3/uL (ref 0.0–0.5)
Eosinophils Relative: 0 %
HCT: 47.7 % — ABNORMAL HIGH (ref 36.0–46.0)
Hemoglobin: 14.7 g/dL (ref 12.0–15.0)
Immature Granulocytes: 2 %
Lymphocytes Relative: 8 %
Lymphs Abs: 1 10*3/uL (ref 0.7–4.0)
MCH: 30.2 pg (ref 26.0–34.0)
MCHC: 30.8 g/dL (ref 30.0–36.0)
MCV: 97.9 fL (ref 80.0–100.0)
Monocytes Absolute: 0.7 10*3/uL (ref 0.1–1.0)
Monocytes Relative: 5 %
Neutro Abs: 11.7 10*3/uL — ABNORMAL HIGH (ref 1.7–7.7)
Neutrophils Relative %: 84 %
Platelets: 343 10*3/uL (ref 150–400)
RBC: 4.87 MIL/uL (ref 3.87–5.11)
RDW: 13.1 % (ref 11.5–15.5)
WBC: 13.9 10*3/uL — ABNORMAL HIGH (ref 4.0–10.5)
nRBC: 0 % (ref 0.0–0.2)

## 2023-04-14 LAB — URINALYSIS, ROUTINE W REFLEX MICROSCOPIC
Bilirubin Urine: NEGATIVE
Glucose, UA: 500 mg/dL — AB
Ketones, ur: >160 mg/dL — AB
Leukocytes,Ua: NEGATIVE
Nitrite: NEGATIVE
Protein, ur: 30 mg/dL — AB
Specific Gravity, Urine: 1.02 (ref 1.005–1.030)
pH: 5 (ref 5.0–8.0)

## 2023-04-14 LAB — COMPREHENSIVE METABOLIC PANEL
ALT: 9 U/L (ref 0–44)
AST: 11 U/L — ABNORMAL LOW (ref 15–41)
Albumin: 3.7 g/dL (ref 3.5–5.0)
Alkaline Phosphatase: 97 U/L (ref 38–126)
Anion gap: 19 — ABNORMAL HIGH (ref 5–15)
BUN: 32 mg/dL — ABNORMAL HIGH (ref 8–23)
CO2: 12 mmol/L — ABNORMAL LOW (ref 22–32)
Calcium: 8.5 mg/dL — ABNORMAL LOW (ref 8.9–10.3)
Chloride: 100 mmol/L (ref 98–111)
Creatinine, Ser: 1.14 mg/dL — ABNORMAL HIGH (ref 0.44–1.00)
GFR, Estimated: 51 mL/min — ABNORMAL LOW (ref >60)
Glucose, Bld: 402 mg/dL — ABNORMAL HIGH (ref 70–99)
Potassium: 4.3 mmol/L (ref 3.5–5.1)
Sodium: 131 mmol/L — ABNORMAL LOW (ref 135–145)
Total Bilirubin: 1.7 mg/dL — ABNORMAL HIGH (ref 0.3–1.2)
Total Protein: 8.2 g/dL — ABNORMAL HIGH (ref 6.5–8.1)

## 2023-04-14 LAB — CBG MONITORING, ED: Glucose-Capillary: 343 mg/dL — ABNORMAL HIGH (ref 70–99)

## 2023-04-14 LAB — BETA-HYDROXYBUTYRIC ACID: Beta-Hydroxybutyric Acid: >8 mmol/L — ABNORMAL HIGH (ref 0.05–0.27)

## 2023-04-14 LAB — URINALYSIS, MICROSCOPIC (REFLEX)

## 2023-04-14 LAB — LACTIC ACID, PLASMA
Lactic Acid, Venous: 1.7 mmol/L (ref 0.5–1.9)
Lactic Acid, Venous: 3 mmol/L (ref 0.5–1.9)

## 2023-04-14 LAB — BLOOD GAS, VENOUS
Acid-base deficit: 19.5 mmol/L — ABNORMAL HIGH (ref 0.0–2.0)
Bicarbonate: 10.8 mmol/L — ABNORMAL LOW (ref 20.0–28.0)
O2 Saturation: 59.3 %
Patient temperature: 37
pCO2, Ven: 41 mmHg — ABNORMAL LOW (ref 44–60)
pH, Ven: 7.03 — CL (ref 7.25–7.43)
pO2, Ven: 35 mmHg (ref 32–45)

## 2023-04-14 LAB — LIPASE, BLOOD: Lipase: 26 U/L (ref 11–51)

## 2023-04-14 MED ORDER — HEPARIN SODIUM (PORCINE) 5000 UNIT/ML IJ SOLN
5000.0000 [IU] | Freq: Three times a day (TID) | INTRAMUSCULAR | Status: DC
  Administered 2023-04-15 – 2023-04-16 (×4): 5000 [IU] via SUBCUTANEOUS
  Filled 2023-04-14 (×4): qty 1

## 2023-04-14 MED ORDER — IOHEXOL 300 MG/ML  SOLN
100.0000 mL | Freq: Once | INTRAMUSCULAR | Status: AC | PRN
  Administered 2023-04-14: 100 mL via INTRAVENOUS

## 2023-04-14 MED ORDER — SODIUM CHLORIDE 0.9 % IV SOLN
2.0000 g | Freq: Every day | INTRAVENOUS | Status: AC
  Administered 2023-04-15 – 2023-04-19 (×5): 2 g via INTRAVENOUS
  Filled 2023-04-14 (×5): qty 20

## 2023-04-14 MED ORDER — MORPHINE SULFATE (PF) 2 MG/ML IV SOLN
2.0000 mg | INTRAVENOUS | Status: DC | PRN
  Administered 2023-04-16: 2 mg via INTRAVENOUS
  Filled 2023-04-14: qty 1

## 2023-04-14 MED ORDER — SODIUM CHLORIDE 0.9 % IV BOLUS
1000.0000 mL | Freq: Once | INTRAVENOUS | Status: AC
  Administered 2023-04-14: 1000 mL via INTRAVENOUS

## 2023-04-14 MED ORDER — ONDANSETRON HCL 4 MG PO TABS: 4.0000 mg | ORAL_TABLET | Freq: Four times a day (QID) | ORAL | Status: DC | PRN

## 2023-04-14 MED ORDER — SODIUM CHLORIDE 0.9 % IV SOLN: INTRAVENOUS | Status: DC

## 2023-04-14 MED ORDER — LATANOPROST 0.005 % OP SOLN
1.0000 [drp] | Freq: Every day | OPHTHALMIC | Status: DC
  Administered 2023-04-15: 1 [drp] via OPHTHALMIC
  Filled 2023-04-14 (×2): qty 2.5

## 2023-04-14 MED ORDER — SODIUM CHLORIDE 0.9 % IV BOLUS: 1000.0000 mL | Freq: Once | INTRAVENOUS | Status: DC

## 2023-04-14 MED ORDER — SODIUM CHLORIDE 0.9 % IV SOLN
2.0000 g | Freq: Once | INTRAVENOUS | Status: DC
  Filled 2023-04-14: qty 20

## 2023-04-14 MED ORDER — HYDROCODONE-ACETAMINOPHEN 5-325 MG PO TABS
1.0000 | ORAL_TABLET | ORAL | Status: DC | PRN
  Administered 2023-04-17: 2 via ORAL
  Filled 2023-04-14: qty 2

## 2023-04-14 MED ORDER — SODIUM CHLORIDE 0.9 % IV SOLN: Freq: Once | INTRAVENOUS | Status: DC

## 2023-04-14 MED ORDER — ACETAMINOPHEN 650 MG RE SUPP: 650.0000 mg | Freq: Four times a day (QID) | RECTAL | Status: DC | PRN

## 2023-04-14 MED ORDER — EXEMESTANE 25 MG PO TABS: 25.0000 mg | ORAL_TABLET | Freq: Every day | ORAL | Status: DC

## 2023-04-14 MED ORDER — INSULIN REGULAR(HUMAN) IN NACL 100-0.9 UT/100ML-% IV SOLN
INTRAVENOUS | Status: DC
  Administered 2023-04-14: 10.5 [IU]/h via INTRAVENOUS
  Filled 2023-04-14: qty 100

## 2023-04-14 MED ORDER — LACTATED RINGERS IV BOLUS
500.0000 mL | Freq: Once | INTRAVENOUS | Status: AC
  Administered 2023-04-14: 500 mL via INTRAVENOUS

## 2023-04-14 MED ORDER — SODIUM CHLORIDE 0.9 % IV SOLN: 500.0000 mg | Freq: Once | INTRAVENOUS | Status: DC

## 2023-04-14 MED ORDER — ONDANSETRON HCL 4 MG/2ML IJ SOLN
4.0000 mg | Freq: Four times a day (QID) | INTRAMUSCULAR | Status: DC | PRN
  Administered 2023-04-15 – 2023-04-19 (×5): 4 mg via INTRAVENOUS
  Filled 2023-04-14 (×5): qty 2

## 2023-04-14 MED ORDER — DEXTROSE 50 % IV SOLN: 0.0000 mL | INTRAVENOUS | Status: DC | PRN

## 2023-04-14 MED ORDER — LACTATED RINGERS IV SOLN: INTRAVENOUS | Status: DC

## 2023-04-14 MED ORDER — DEXTROSE IN LACTATED RINGERS 5 % IV SOLN: INTRAVENOUS | Status: DC

## 2023-04-14 MED ORDER — ACETAMINOPHEN 325 MG PO TABS
650.0000 mg | ORAL_TABLET | Freq: Four times a day (QID) | ORAL | Status: DC | PRN
  Administered 2023-04-19: 650 mg via ORAL
  Filled 2023-04-14: qty 2

## 2023-04-14 MED ORDER — SODIUM CHLORIDE 0.9 % IV SOLN
500.0000 mg | Freq: Every day | INTRAVENOUS | Status: DC
  Administered 2023-04-15: 500 mg via INTRAVENOUS
  Filled 2023-04-14: qty 5

## 2023-04-14 MED ORDER — POTASSIUM CHLORIDE 10 MEQ/100ML IV SOLN
10.0000 meq | INTRAVENOUS | Status: AC
  Administered 2023-04-14 (×2): 10 meq via INTRAVENOUS
  Filled 2023-04-14: qty 100

## 2023-04-14 MED ORDER — TRAZODONE HCL 100 MG PO TABS: 100.0000 mg | ORAL_TABLET | Freq: Every day | ORAL | Status: DC

## 2023-04-14 MED ORDER — GUAIFENESIN ER 600 MG PO TB12
600.0000 mg | ORAL_TABLET | Freq: Two times a day (BID) | ORAL | Status: DC | PRN
  Administered 2023-04-16 – 2023-04-18 (×3): 600 mg via ORAL
  Filled 2023-04-14 (×3): qty 1

## 2023-04-14 MED ORDER — HYDRALAZINE HCL 20 MG/ML IJ SOLN: 5.0000 mg | INTRAMUSCULAR | Status: DC | PRN

## 2023-04-14 MED ORDER — ALBUTEROL SULFATE (2.5 MG/3ML) 0.083% IN NEBU
2.5000 mg | INHALATION_SOLUTION | RESPIRATORY_TRACT | Status: DC | PRN
  Administered 2023-04-16 – 2023-04-18 (×3): 2.5 mg via RESPIRATORY_TRACT
  Filled 2023-04-14 (×4): qty 3

## 2023-04-14 NOTE — H&P (Signed)
History and Physical     Patient: Jasmine Velasquez:[id number] DOB: [birth year] DOA: 04/14/2023 DOS: the patient was seen and examined on 04/15/2023 PCP: Marisue Ivan, MD   Patient coming from:  Home  Chief Complaint: Nausea vomiting.  HISTORY OF PRESENT ILLNESS: Jasmine Velasquez is an 72 y.o. female brought to the emergency room with complaints of nausea vomiting of blood glucose reading high. Patient has a history of poorly controlled diabetes mellitus that is insulin-dependent but has never had a history of DKA in the past. Patient states that for the past few days about a week she has had nausea and vomiting and has not been able to eat anything or keep anything down. Discussed with patient that probably from the vomiting she has aspirated and now also pneumonia that we will be treating. Patient is extremely weak and thirsty and dry appearing.   Past Medical History:  Diagnosis Date   Arthritis    Asthma    Bipolar 1 disorder (HCC) 12/02/1990   Bladder infection    Breast CA (HCC) 12/02/2008   Breast cancer (HCC) 12/02/2008   left breast mastectomy NO chemo or rad   Cellulitis and abscess of buttock 12/03/2011   Diabetes (HCC)    non insulin   Glaucoma    right eye   Hypothyroidism    Personal history of colonic polyps    Personal history of malignant neoplasm of breast    Pneumonia    Review of Systems  Constitutional:  Positive for fatigue.  Respiratory:  Positive for shortness of breath.   Endocrine: Positive for polydipsia and polyuria.  Neurological:  Positive for weakness.  All other systems reviewed and are negative.  Allergies  Allergen Reactions   Penicillins Rash   Past Surgical History:  Procedure Laterality Date   ABDOMINAL HYSTERECTOMY  age 20   BREAST BIOPSY Right 2010   core bx- neg   CARDIAC CATHETERIZATION     CHOLECYSTECTOMY     COLONOSCOPY  2013   MASTECTOMY Left 2010   UPPER GASTROINTESTINAL ENDOSCOPY  2013   MEDICATIONS:   sodium chloride     azithromycin (ZITHROMAX) 500 mg in sodium chloride 0.9 % 250 mL IVPB     azithromycin     cefTRIAXone (ROCEPHIN)  IV     cefTRIAXone (ROCEPHIN)  IV     dextrose 5% lactated ringers 125 mL/hr at 04/15/23 0119   insulin 3.2 Units/hr (04/15/23 0116)  ED Course: Pt in Ed is in hallway, she is in no distress.  Patient meets severe sepsis criteria with source heart rate and vitals but also white count. Vitals:   04/14/23 1957 04/14/23 2003 04/14/23 2004  BP:   (!) 158/62  Pulse:   (!) 119  Resp:   (!) 27  Temp:   98.3 F (36.8 C)  TempSrc:   Oral  SpO2: 97%  97%  Weight:  72.6 kg   Height:  5\' 6"  (1.676 m)    No intake or output data in the 24 hours ending 04/15/23 0125  SpO2: 97 % Blood work in ed shows: VBG showing a pH of 7.03 pCO2 of 41 bicarb of 10.8. CMP showing hyponatremia of 131 glucose 4 2 creatinine of 1.14 anion gap of 19 serum bicarb of 12 Total protein of 8.2 total bili of 1.7. Initial lactic of 1.7 repeat lactic of 3.0.  Results for orders placed or performed during the hospital encounter of 04/14/23 (from the past 72 hour(s))  CBC with Differential  Status: Abnormal   Collection Time: 04/14/23  8:25 PM  Result Value Ref Range   WBC 13.9 (H) 4.0 - 10.5 K/uL   RBC 4.87 3.87 - 5.11 MIL/uL   Hemoglobin 14.7 12.0 - 15.0 g/dL   HCT 65.7 (H) 84.6 - 96.2 %   MCV 97.9 80.0 - 100.0 fL   MCH 30.2 26.0 - 34.0 pg   MCHC 30.8 30.0 - 36.0 g/dL   RDW 95.2 84.1 - 32.4 %   Platelets 343 150 - 400 K/uL   nRBC 0.0 0.0 - 0.2 %   Neutrophils Relative % 84 %   Neutro Abs 11.7 (H) 1.7 - 7.7 K/uL   Lymphocytes Relative 8 %   Lymphs Abs 1.0 0.7 - 4.0 K/uL   Monocytes Relative 5 %   Monocytes Absolute 0.7 0.1 - 1.0 K/uL   Eosinophils Relative 0 %   Eosinophils Absolute 0.0 0.0 - 0.5 K/uL   Basophils Relative 1 %   Basophils Absolute 0.1 0.0 - 0.1 K/uL   Immature Granulocytes 2 %   Abs Immature Granulocytes 0.33 (H) 0.00 - 0.07 K/uL    Comment: Performed  at [facility], [address] [place], [place] [address]  Comprehensive metabolic panel     Status: Abnormal   Collection Time: 04/14/23  8:25 PM  Result Value Ref Range   Sodium 131 (L) 135 - 145 mmol/L   Potassium 4.3 3.5 - 5.1 mmol/L   Chloride 100 98 - 111 mmol/L   CO2 12 (L) 22 - 32 mmol/L   Glucose, Bld 402 (H) 70 - 99 mg/dL    Comment: Glucose reference range applies only to samples taken after fasting for at least 8 hours.   BUN 32 (H) 8 - 23 mg/dL   Creatinine, Ser 7.25 (H) 0.44 - 1.00 mg/dL   Calcium 8.5 (L) 8.9 - 10.3 mg/dL   Total Protein 8.2 (H) 6.5 - 8.1 g/dL   Albumin 3.7 3.5 - 5.0 g/dL   AST 11 (L) 15 - 41 U/L   ALT 9 0 - 44 U/L   Alkaline Phosphatase 97 38 - 126 U/L   Total Bilirubin 1.7 (H) 0.3 - 1.2 mg/dL   GFR, Estimated 51 (L) >60 mL/min    Comment: (NOTE) Calculated using the CKD-EPI Creatinine Equation (2021)    Anion gap 19 (H) 5 - 15    Comment: Performed at [facility], [address] [place], [place] [address]  Beta-hydroxybutyric acid     Status: Abnormal   Collection Time: 04/14/23  8:25 PM  Result Value Ref Range   Beta-Hydroxybutyric Acid >8.00 (H) 0.05 - 0.27 mmol/L    Comment: RESULT CONFIRMED BY MANUAL DILUTION MU/SKL Performed at [facility], [address] [place], [place] [address]   Lipase, blood     Status: None   Collection Time: 04/14/23  8:25 PM  Result Value Ref Range   Lipase 26 11 - 51 U/L    Comment: Performed at [facility], [address] [place], [place] [address]  Lactic acid, plasma     Status: None   Collection Time: 04/14/23  8:25 PM  Result Value Ref Range   Lactic Acid, Venous 1.7 0.5 - 1.9 mmol/L    Comment: Performed at [facility], [address] [place], [place] [address]  Blood gas, venous     Status: Abnormal   Collection Time: 04/14/23  8:25 PM  Result Value Ref Range   pH, Ven 7.03 (  LL) 7.25 - 7.43    Comment: CRITICAL RESULT CALLED TO, READ BACK BY  AND VERIFIED WITH: CAMERON ISAACS AT 2052 ON 04/14/23 KSL    pCO2, Ven 41 (L) 44 - 60 mmHg   pO2, Ven 35 32 - 45 mmHg   Bicarbonate 10.8 (L) 20.0 - 28.0 mmol/L   Acid-base deficit 19.5 (H) 0.0 - 2.0 mmol/L   O2 Saturation 59.3 %   Patient temperature 37.0    Collection site VEIN     Comment: Performed at [facility], [address] [place], [place] [address]  CBG monitoring, ED     Status: Abnormal   Collection Time: 04/14/23 10:33 PM  Result Value Ref Range   Glucose-Capillary 343 (H) 70 - 99 mg/dL    Comment: Glucose reference range applies only to samples taken after fasting for at least 8 hours.   Comment 1 Notify RN    Comment 2 Call MD NNP PA CNM   Urinalysis, Routine w reflex microscopic -Urine, Clean Catch     Status: Abnormal   Collection Time: 04/14/23 10:39 PM  Result Value Ref Range   Color, Urine YELLOW YELLOW   APPearance CLEAR CLEAR   Specific Gravity, Urine 1.020 1.005 - 1.030   pH 5.0 5.0 - 8.0   Glucose, UA 500 (A) NEGATIVE mg/dL   Hgb urine dipstick TRACE (A) NEGATIVE   Bilirubin Urine NEGATIVE NEGATIVE   Ketones, ur >160 (A) NEGATIVE mg/dL   Protein, ur 30 (A) NEGATIVE mg/dL   Nitrite NEGATIVE NEGATIVE   Leukocytes,Ua NEGATIVE NEGATIVE    Comment: Performed at [facility], [address] [place], [place] [address]  Urinalysis, Microscopic (reflex)     Status: Abnormal   Collection Time: 04/14/23 10:39 PM  Result Value Ref Range   RBC / HPF 0-5 0 - 5 RBC/hpf   WBC, UA 0-5 0 - 5 WBC/hpf   Bacteria, UA RARE (A) NONE SEEN   Squamous Epithelial / HPF 0-5 0 - 5 /HPF   Mucus PRESENT     Comment: Performed at [facility], [address] [place], [place] [address]  Lactic acid, plasma     Status: Abnormal   Collection Time: 04/14/23 10:56 PM  Result Value Ref Range   Lactic Acid, Venous 3.0 (HH) 0.5 - 1.9 mmol/L    Comment: CRITICAL RESULT CALLED TO, READ BACK BY AND VERIFIED WITH Tiffany Brown @2321  on 04/14/23  skl Performed at [facility], [address] [place], [place] [address]   Beta-hydroxybutyric acid     Status: Abnormal   Collection Time: 04/14/23 11:42 PM  Result Value Ref Range   Beta-Hydroxybutyric Acid >8.00 (H) 0.05 - 0.27 mmol/L    Comment: RESULT CONFIRMED BY MANUAL DILUTION SKL Performed at [facility], [address] [place], [place] [address]   Basic metabolic panel     Status: Abnormal   Collection Time: 04/14/23 11:42 PM  Result Value Ref Range   Sodium 133 (L) 135 - 145 mmol/L   Potassium 4.4 3.5 - 5.1 mmol/L   Chloride 104 98 - 111 mmol/L   CO2 9 (L) 22 - 32 mmol/L   Glucose, Bld 358 (H) 70 - 99 mg/dL    Comment: Glucose reference range applies only to samples taken after fasting for at least 8 hours.   BUN 29 (H) 8 - 23 mg/dL   Creatinine, Ser 9.52 (H) 0.44 - 1.00 mg/dL   Calcium 8.2 (L) 8.9 - 10.3 mg/dL  GFR, Estimated 54 (L) >60 mL/min    Comment: (NOTE) Calculated using the CKD-EPI Creatinine Equation (2021)    Anion gap 20 (H) 5 - 15    Comment: Performed at [facility], [address] [place], [place] [address]  CBG monitoring, ED     Status: Abnormal   Collection Time: 04/15/23 12:01 AM  Result Value Ref Range   Glucose-Capillary 279 (H) 70 - 99 mg/dL    Comment: Glucose reference range applies only to samples taken after fasting for at least 8 hours.  CBG monitoring, ED     Status: Abnormal   Collection Time: 04/15/23  1:10 AM  Result Value Ref Range   Glucose-Capillary 192 (H) 70 - 99 mg/dL    Comment: Glucose reference range applies only to samples taken after fasting for at least 8 hours.   Comment 1 Notify RN    Comment 2 Call MD NNP PA CNM     Lab Results  Component Value Date   CREATININE 1.09 (H) 04/14/2023   CREATININE 1.14 (H) 04/14/2023      Latest Ref Rng & Units 04/14/2023   11:42 PM 04/14/2023    8:25 PM  CMP  Glucose 70 - 99 mg/dL 528  413   BUN 8 - 23 mg/dL 29  32   Creatinine 2.44 - 1.00  mg/dL 0.10  2.72   Sodium 536 - 145 mmol/L 133  131   Potassium 3.5 - 5.1 mmol/L 4.4  4.3   Chloride 98 - 111 mmol/L 104  100   CO2 22 - 32 mmol/L 9  12   Calcium 8.9 - 10.3 mg/dL 8.2  8.5   Total Protein 6.5 - 8.1 g/dL  8.2   Total Bilirubin 0.3 - 1.2 mg/dL  1.7   Alkaline Phos 38 - 126 U/L  97   AST 15 - 41 U/L  11   ALT 0 - 44 U/L  9     Unresulted Labs (From admission, onward)     Start     Ordered   04/15/23 0116  Hemoglobin A1c  Add-on,   AD        04/15/23 0115   04/14/23 2322  Resp panel by RT-PCR (RSV, Flu A&B, Covid) Anterior Nasal Swab  (COPD / Pneumonia / Cellulitis / Lower Extremity Wound)  Once,   R        04/14/23 2324   04/14/23 2300  Basic metabolic panel  (Diabetes Ketoacidosis (DKA))  STAT Now then every 4 hours ,   STAT      04/14/23 2104   04/14/23 2300  Beta-hydroxybutyric acid  (Diabetes Ketoacidosis (DKA))  Now then every 8 hours,   URGENT      04/14/23 2104   04/14/23 2009  Blood culture (single)  Once,   STAT        04/14/23 2009           Pt has received : Orders Placed This Encounter  Procedures   Critical Care    This order was created via procedure documentation    Standing Status:   Standing    Number of Occurrences:   1   Blood culture (single)    Standing Status:   Standing    Number of Occurrences:   1   Resp panel by RT-PCR (RSV, Flu A&B, Covid) Anterior Nasal Swab    Standing Status:   Standing    Number of Occurrences:   1   CT HEAD  WO CONTRAST ( )    Standing Status:   Standing    Number of Occurrences:   1   CT ABDOMEN PELVIS W CONTRAST    Standing Status:   Standing    Number of Occurrences:   1    Order Specific Question:   Bypass Screening Labs?    Answer:   I, as the ordering provider, have weighed the risks vs. benefits for this patient and concluded this exam meets the requirements for medical necessity to bypass lab work needed prior to CT.    Order Specific Question:   Does the patient have a contrast media/X-ray  dye allergy?    Answer:   No    Order Specific Question:   If indicated for the ordered procedure, I authorize the administration of contrast media per Radiology protocol    Answer:   Yes    Order Specific Question:   If indicated for the ordered procedure, I authorize the administration of oral contrast media per Radiology protocol    Answer:   Yes   DG Chest Portable 1 View    Standing Status:   Standing    Number of Occurrences:   1    Order Specific Question:   Reason for Exam (SYMPTOM  OR DIAGNOSIS REQUIRED)    Answer:   SOB   CBC with Differential    Standing Status:   Standing    Number of Occurrences:   1   Comprehensive metabolic panel    Standing Status:   Standing    Number of Occurrences:   1   Urinalysis, Routine w reflex microscopic -Urine, Clean Catch    Standing Status:   Standing    Number of Occurrences:   1    Order Specific Question:   Specimen Source    Answer:   Urine, Clean Catch [76]   Beta-hydroxybutyric acid    Standing Status:   Standing    Number of Occurrences:   1   Lipase, blood    Standing Status:   Standing    Number of Occurrences:   1   Lactic acid, plasma    Standing Status:   Standing    Number of Occurrences:   2   Blood gas, venous    Standing Status:   Standing    Number of Occurrences:   1   Basic metabolic panel    Standing Status:   Standing    Number of Occurrences:   5   Beta-hydroxybutyric acid    Standing Status:   Standing    Number of Occurrences:   3   Urinalysis, Microscopic (reflex)    Standing Status:   Standing    Number of Occurrences:   1   Beta-hydroxybutyric acid    Standing Status:   Standing    Number of Occurrences:   1   Basic metabolic panel    Standing Status:   Standing    Number of Occurrences:   1   Hemoglobin A1c    Standing Status:   Standing    Number of Occurrences:   1   Diet NPO time specified Except for: Sips with Meds    Standing Status:   Standing    Number of Occurrences:   1    Order  Specific Question:   Except for    Answer:   Sips with Meds   ED Cardiac monitoring    Standing Status:   Standing    Number of  Occurrences:   1   Initiate Carrier Fluid Protocol    Standing Status:   Standing    Number of Occurrences:   1   Notify physician (specify)    Standing Status:   Standing    Number of Occurrences:   1    Order Specific Question:   Notify physician for    Answer:   K+ <3.5 mmol/L    Order Specific Question:   Notify physician for    Answer:   Sudden headache    Order Specific Question:   Notify physician for    Answer:   Change in LOC    Order Specific Question:   Notify physician    Answer:   As prompted by EndoTool    Order Specific Question:   Notify physician for    Answer:   All abnormal BMET and Beta-Hydroxybutyrate Acid results   If present, discontinue Insulin Pump after IV Insulin is initiated.    Standing Status:   Standing    Number of Occurrences:   1   Do NOT use lab glucose values in EndoTool.  If CBG meter reads "Critical High", enter 600.    Standing Status:   Standing    Number of Occurrences:   1   IV insulin infusion with sufficient glucose should be continued until MD determines acidosis is corrected and places transition orders.    Standing Status:   Standing    Number of Occurrences:   1   Upon IV fluid bolus completion, place order for STAT BMET (LAB15) and call provider with results.    Standing Status:   Standing    Number of Occurrences:   1   Apply Pneumonia Care Plan    Standing Status:   Standing    Number of Occurrences:   1   Cardiac Monitoring Continuous x 24 hours Indications for use: Other; other indications for use: PNA    Standing Status:   Standing    Number of Occurrences:   1    Order Specific Question:   Indications for use:    Answer:   Other    Order Specific Question:   other indications for use:    Answer:   PNA   Vital signs    Standing Status:   Standing    Number of Occurrences:   1   Notify  physician (specify)    Standing Status:   Standing    Number of Occurrences:   20    Order Specific Question:   Notify Physician    Answer:   for pulse less than 55 or greater than 120    Order Specific Question:   Notify Physician    Answer:   for respiratory rate less than 12 or greater than 25    Order Specific Question:   Notify Physician    Answer:   for temperature greater than 100.5 F    Order Specific Question:   Notify Physician    Answer:   for urinary output less than 30 mL/hr for four hours    Order Specific Question:   Notify Physician    Answer:   for systolic BP less than 90 or greater than 160, diastolic BP less than 60 or greater than 100    Order Specific Question:   Notify Physician    Answer:   for new hypoxia w/ oxygen saturations < 88%   Progressive Mobility Protocol: No Restrictions    Standing Status:  Standing    Number of Occurrences:   1   If patient diabetic or glucose greater than 140 notify physician for Sliding Scale Insulin Orders    Standing Status:   Standing    Number of Occurrences:   20   Intake and Output    Standing Status:   Standing    Number of Occurrences:   1   Do not place and if present remove PureWick    Standing Status:   Standing    Number of Occurrences:   1   Initiate Oral Care Protocol    Standing Status:   Standing    Number of Occurrences:   1   Initiate Carrier Fluid Protocol    Standing Status:   Standing    Number of Occurrences:   1   RN may order General Admission PRN Orders utilizing "General Admission PRN medications" (through manage orders) for the following patient needs: allergy symptoms (Claritin), cold sores (Carmex), cough (Robitussin DM), eye irritation (Liquifilm Tears), hemorrhoids (Tucks), indigestion (Maalox), minor skin irritation (Hydrocortisone Cream), muscle pain Romeo Apple Gay), nose irritation (saline nasal spray) and sore throat (Chloraseptic spray).    Standing Status:   Standing    Number of Occurrences:    [phone]   Apply Diabetes Mellitus Care Plan    Standing Status:   Standing    Number of Occurrences:   1   Apply Diabetic Ketoacidosis Care Plan    Standing Status:   Standing    Number of Occurrences:   1   Refer to Hyperglycemic Crisis (DKA, HHS, Hyperglycemia) sidebar    Standing Status:   Standing    Number of Occurrences:   1   Strict intake and output    Standing Status:   Standing    Number of Occurrences:   1   Upon IV fluid bolus completion, place order for STAT BMET (LAB15) and call provider with results.    Standing Status:   Standing    Number of Occurrences:   1   Full code    Standing Status:   Standing    Number of Occurrences:   1    Order Specific Question:   By:    Answer:   Other   Consult to hospitalist    Standing Status:   Standing    Number of Occurrences:   1    Order Specific Question:   Place call to:    Answer:   Hospitalist    Order Specific Question:   Reason for Consult    Answer:   Admit   Consult to respiratory care treatment (RT)    Standing Status:   Standing    Number of Occurrences:   1    Order Specific Question:   Reason for Consult?    Answer:   Evaluation   Consult to diabetes coordinator    Standing Status:   Standing    Number of Occurrences:   1    Order Specific Question:   Reason for Consult?    Answer:   DM management evaluation   Droplet precaution    Standing Status:   Standing    Number of Occurrences:   1   OT eval and treat    Standing Status:   Standing    Number of Occurrences:   1   PT eval and treat    Standing Status:   Standing    Number of Occurrences:   1   Pulse oximetry  check with vital signs    Standing Status:   Standing    Number of Occurrences:   1   Oxygen therapy Mode or (Route): Nasal cannula; Liters Per Minute: 2; Keep 02 saturation: greater than 92 %    Standing Status:   Standing    Number of Occurrences:   1    Order Specific Question:   Mode or (Route)    Answer:   Nasal cannula    Order  Specific Question:   Liters Per Minute    Answer:   2    Order Specific Question:   Keep 02 saturation    Answer:   greater than 92 %   Incentive spirometry    Standing Status:   Standing    Number of Occurrences:   1   CBG monitoring, ED    As directed by EndoTool.    Standing Status:   Standing    Number of Occurrences:   99999   CBG monitoring, ED    Standing Status:   Standing    Number of Occurrences:   1   CBG monitoring, ED    Standing Status:   Standing    Number of Occurrences:   1   CBG monitoring, ED    Standing Status:   Standing    Number of Occurrences:   1   EKG 12-Lead    Standing Status:   Standing    Number of Occurrences:   1   Insert peripheral IV    Standing Status:   Standing    Number of Occurrences:   1   Admit to Inpatient (patient's expected length of stay will be greater than 2 midnights or inpatient only procedure)    Standing Status:   Standing    Number of Occurrences:   1    Order Specific Question:   Hospital Area    Answer:   [facility] [[id number]]    Order Specific Question:   Level of Care    Answer:   Stepdown [14]    Order Specific Question:   Covid Evaluation    Answer:   Symptomatic Person Under Investigation (PUI) or recent exposure (last 10 days) *Testing Required*    Order Specific Question:   Diagnosis    Answer:   SOB (shortness of breath) [604540]    Order Specific Question:   Admitting Physician    Answer:   Darrold Junker    Order Specific Question:   Attending Physician    Answer:   Darrold Junker    Order Specific Question:   Certification:    Answer:   I certify this patient will need inpatient services for at least 2 midnights    Order Specific Question:   Estimated Length of Stay    Answer:   2   Aspiration precautions    Standing Status:   Standing    Number of Occurrences:   1   Fall precautions    Standing Status:   Standing    Number of Occurrences:   1    Meds ordered this  encounter  Medications   sodium chloride 0.9 % bolus 1,000 mL   0.9 %  sodium chloride infusion   DISCONTD: sodium chloride 0.9 % bolus 1,000 mL   insulin regular, human (MYXREDLIN) 100 units/ 100 mL infusion    Order Specific Question:   EndoTool low target:    Answer:   140  Order Specific Question:   EndoTool high target:    Answer:   180    Order Specific Question:   Type of Diabetes    Answer:   Type 2    Order Specific Question:   Mode of Therapy    Answer:   ENDOX1 for DKA    Order Specific Question:   Start Method    Answer:   EndoTool to calculate   DISCONTD: lactated ringers infusion   dextrose 5 % in lactated ringers infusion   dextrose 50 % solution 0-50 mL   potassium chloride 10 mEq in 100 mL IVPB   lactated ringers bolus 500 mL   iohexol (OMNIPAQUE) 300 MG/ML solution 100 mL   cefTRIAXone (ROCEPHIN) 2 g in sodium chloride 0.9 % 100 mL IVPB    Order Specific Question:   Antibiotic Indication:    Answer:   CAP   azithromycin (ZITHROMAX) 500 mg in sodium chloride 0.9 % 250 mL IVPB   exemestane (AROMASIN) tablet 25 mg   latanoprost (XALATAN) 0.005 % ophthalmic solution 1 drop   traZODone (DESYREL) tablet 100 mg   DISCONTD: 0.9 %  sodium chloride infusion   OR Linked Order Group    acetaminophen (TYLENOL) tablet 650 mg    acetaminophen (TYLENOL) suppository 650 mg   HYDROcodone-acetaminophen (NORCO/VICODIN) 5-325 MG per tablet 1-2 tablet   morphine (PF) 2 MG/ML injection 2 mg   OR Linked Order Group    ondansetron (ZOFRAN) tablet 4 mg    ondansetron (ZOFRAN) injection 4 mg   albuterol (PROVENTIL) (2.5 MG/3ML) 0.083% nebulizer solution 2.5 mg   guaiFENesin (MUCINEX) 12 hr tablet 600 mg   hydrALAZINE (APRESOLINE) injection 5 mg   cefTRIAXone (ROCEPHIN) 2 g in sodium chloride 0.9 % 100 mL IVPB    Order Specific Question:   Antibiotic Indication:    Answer:   CAP   azithromycin (ZITHROMAX) 500 mg in sodium chloride 0.9 % 250 mL IVPB    Order Specific Question:    Antibiotic Indication:    Answer:   CAP   heparin injection 5,000 Units   DISCONTD: lactated ringers bolus 500 mL   lactated ringers bolus 1,000 mL   pantoprazole (PROTONIX) injection 40 mg    Admission Imaging : CT HEAD WO CONTRAST ( )  Result Date: 04/14/2023 CLINICAL DATA:  Mental status change, unknown cause EXAM: CT HEAD WITHOUT CONTRAST TECHNIQUE: Contiguous axial images were obtained from the base of the skull through the vertex without intravenous contrast. RADIATION DOSE REDUCTION: This exam was performed according to the departmental dose-optimization program which includes automated exposure control, adjustment of the mA and/or kV according to patient size and/or use of iterative reconstruction technique. COMPARISON:  None Available. FINDINGS: Brain: No evidence of large-territorial acute infarction. No parenchymal hemorrhage. No mass lesion. No extra-axial collection. No mass effect or midline shift. No hydrocephalus. Basilar cisterns are patent. Vascular: No hyperdense vessel. Skull: No acute fracture or focal lesion. Sinuses/Orbits: Paranasal sinuses and mastoid air cells are clear. The orbits are unremarkable. Other: None. IMPRESSION: No acute intracranial abnormality. Electronically Signed   By: Tish Frederickson M.D.   On: 04/14/2023 22:16   CT ABDOMEN PELVIS W CONTRAST  Result Date: 04/14/2023 CLINICAL DATA:  Abdominal pain, acute, nonlocalized EXAM: CT ABDOMEN AND PELVIS WITH CONTRAST TECHNIQUE: Multidetector CT imaging of the abdomen and pelvis was performed using the standard protocol following bolus administration of intravenous contrast. RADIATION DOSE REDUCTION: This exam was performed according to the departmental dose-optimization program which includes  automated exposure control, adjustment of the mA and/or kV according to patient size and/or use of iterative reconstruction technique. CONTRAST:  OMNIPAQUE IOHEXOL 300 MG/ML  SOLN COMPARISON:  Chest x-ray 04/14/2023  FINDINGS: Lower chest: Bibasilar vague tree-in-bud nodularity. Hepatobiliary: No focal liver abnormality. Status post cholecystectomy. No biliary dilatation. Pancreas: Diffusely atrophic. No focal lesion. Otherwise normal pancreatic contour. No surrounding inflammatory changes. No main pancreatic ductal dilatation. Spleen: Normal in size without focal abnormality. Adrenals/Urinary Tract: No adrenal nodule bilaterally. Bilateral kidneys enhance symmetrically. No hydronephrosis. No hydroureter. The urinary bladder is unremarkable. On delayed imaging, there is no urothelial wall thickening and there are no filling defects in the opacified portions of the bilateral collecting systems or ureters. Stomach/Bowel: Stomach is within normal limits. No evidence of bowel wall thickening or dilatation. The appendix is not definitely identified with no inflammatory changes in the right lower quadrant to suggest acute appendicitis. Vascular/Lymphatic: No abdominal aorta or iliac aneurysm. Mild to moderate atherosclerotic plaque of the aorta and its branches. No abdominal, pelvic, or inguinal lymphadenopathy. Reproductive: Status post hysterectomy. No adnexal masses. Other: No intraperitoneal free fluid. No intraperitoneal free gas. No organized fluid collection. Musculoskeletal: Tiny fat containing umbilical hernia. No suspicious lytic or blastic osseous lesions. No acute displaced fracture. IMPRESSION: 1. Bibasilar vague tree-in-bud nodularity. Findings suggestive of atypical infection. No follow-up needed if patient is low-risk (and has no known or suspected primary neoplasm). Non-contrast chest CT can be considered in 12 months if patient is high-risk. This recommendation follows the consensus statement: Guidelines for Management of Incidental Pulmonary Nodules Detected on CT Images: From the Fleischner Society 2017; Radiology 2017; 284:228-243. 2. No acute intra-abdominal or intrapelvic abnormality. 3. Aortic Atherosclerosis  (ICD10-I70.0). Electronically Signed   By: Tish Frederickson M.D.   On: 04/14/2023 22:02   DG Chest Portable 1 View  Result Date: 04/14/2023 CLINICAL DATA:  Shortness of breath EXAM: PORTABLE CHEST 1 VIEW COMPARISON:  11/28/2011 FINDINGS: Cardiac shadow is within normal limits. Aortic calcifications are seen. Lungs are well aerated bilaterally. Mild increased opacity is noted in the right lung base consistent with developing infiltrate. No bony abnormality is noted. IMPRESSION: Increasing airspace opacity in the right base. Electronically Signed   By: Alcide Clever M.D.   On: 04/14/2023 21:49    Physical Examination: Vitals:   04/14/23 1957 04/14/23 2003 04/14/23 2004  BP:   (!) 158/62  Pulse:   (!) 119  Temp:   98.3 F (36.8 C)  Resp:   (!) 27  Height:  5\' 6"  (1.676 m)   Weight:  72.6 kg   SpO2: 97%  97%  TempSrc:   Oral  BMI (Calculated):  25.84    Physical Exam Vitals and nursing note reviewed.  Constitutional:      General: She is not in acute distress. HENT:     Head: Normocephalic and atraumatic.     Right Ear: Hearing and external ear normal.     Left Ear: Hearing and external ear normal.     Nose: Nose normal. No nasal deformity.     Mouth/Throat:     Lips: Pink.     Mouth: Mucous membranes are dry.     Tongue: No lesions.     Pharynx: Oropharynx is clear.  Eyes:     General: Lids are normal.     Extraocular Movements: Extraocular movements intact.  Cardiovascular:     Rate and Rhythm: Regular rhythm. Tachycardia present.     Heart sounds: Normal heart sounds.  Pulmonary:     Effort: Tachypnea present.     Breath sounds: Examination of the right-middle field reveals wheezing. Examination of the left-middle field reveals wheezing. Examination of the right-lower field reveals wheezing. Examination of the left-lower field reveals wheezing. Wheezing present.  Abdominal:     General: Bowel sounds are normal. There is no distension.     Palpations: Abdomen is soft. There  is no mass.     Tenderness: There is no abdominal tenderness.  Musculoskeletal:     Right lower leg: No edema.     Left lower leg: No edema.  Skin:    General: Skin is warm.  Neurological:     General: No focal deficit present.     Mental Status: She is alert and oriented to person, place, and time.     Cranial Nerves: Cranial nerves 2-12 are intact.  Psychiatric:        Attention and Perception: Attention normal.        Mood and Affect: Mood normal.        Speech: Speech normal.        Behavior: Behavior normal. Behavior is cooperative.     Assessment and Plan: * SOB (shortness of breath) SpO2: 97 % Patient is on room air currently. Attribute shortness of breath secondary pneumonia or vomiting and possibly aspiration. Continue patient on Rocephin and azithromycin.   Severe sepsis [facility]) Patient meets severe sepsis criteria with a white count vitals and pneumonia. Continue with additional supportive measures. Continue with IV fluids and follow blood cultures.   Glaucoma Continue patient on her latanoprost in her right eye.    CAP (community acquired pneumonia) We will cont pt on CAP regimen although suspect aspiration may be a component in her as she has been having vomiting. Continue patient on Rocephin and azithromycin.   DKA (diabetic ketoacidosis) (HCC) Patient started on DKA protocol. Accu-Cheks and blood sugars and titrate insulin per protocol. Will continue monitoring for electrolytes magnesium potassium and phosphorus and replace.    Type 2 diabetes mellitus with hyperglycemia, with long-term current use of insulin (HCC) Patient has a history of diabetes mellitus type 2 and has been insulin requiring and has been poorly controlled per primary care note.  Home regimen consists of Amaryl, Glucophage, Januvia. Patient has room to increase her Januvia dose upon discharge. Patient will most likely require insulin teaching and long-acting insulin for optimizing  blood sugar. Will obtain an A1c.    Acquired hypothyroidism Continue patient on 100 mcg of levothyroxine.     DVT prophylaxis:  Heparin.  Code Status:  Full code.      04/14/2023    8:05 PM  Advanced Directives  Does Patient Have a Medical Advance Directive? Yes  Type of Advance Directive Healthcare Power of Attorney  Does patient want to make changes to medical advance directive? No - Guardian declined   Family Communication:  None. Emergency Contact: Contact Information     Name Relation Home Work Mobile   Coggins,Marie Niece [phone]        Disposition Plan:  Home.  Consults: None. Admission status: Inpatient.   Unit / Expected LOS: Stepdown. Gertha Calkin MD [facility]  6 PM- 2 AM. [phone]( Pager )  Please use [email or web address] to contact the current assigned [facility] MD OR You may also call [phone] to contact current Assigned [facility] Attending/Consulting MD for this patient.

## 2023-04-14 NOTE — ED Provider Notes (Signed)
[facility] Provider Note    Event Date/Time   First MD Initiated Contact with Patient 04/14/23 1956     (approximate)   History   Hyperglycemia and Shortness of Breath   HPI  Jasmine Velasquez is a 72 y.o. female here with hyperglycemia and SOB. Pt reports that over the past week, she has had progressively worsening nausea, and inability to eat/drink. She has been unable to keep anything down. Over the past 24 hours she has had worsening SOB and feels weak and fatigued. She was too weak to stand today but eventually was able to get to a phone to call EMS. Reports no major pain. She has been very thirsty and urinating more frequently. No cough. No chest pain.       Physical Exam   Triage Vital Signs: ED Triage Vitals  Enc Vitals Group     BP 04/14/23 2004 (!) 158/62     Pulse Rate 04/14/23 2004 (!) 119     Resp 04/14/23 2004 (!) 27     Temp 04/14/23 2004 98.3 F (36.8 C)     Temp Source 04/14/23 2004 Oral     SpO2 04/14/23 1957 97 %     Weight 04/14/23 2003 160 lb (72.6 kg)     Height 04/14/23 2003 5\' 6"  (1.676 m)     Head Circumference --      Peak Flow --      Pain Score 04/14/23 2003 0     Pain Loc --      Pain Edu? --      Excl. in GC? --     Most recent vital signs: Vitals:   04/14/23 1957 04/14/23 2004  BP:  (!) 158/62  Pulse:  (!) 119  Resp:  (!) 27  Temp:  98.3 F (36.8 C)  SpO2: 97% 97%     General: Awake, no distress.  CV:  Good peripheral perfusion. Tachycardic. Resp:  Tachypneic but with clear breath sounds bilaterally. Abd:  No distention. No tenderness. Other:  Dry MM.   ED Results / Procedures / Treatments   Labs (all labs ordered are listed, but only abnormal results are displayed) Labs Reviewed  CBC WITH DIFFERENTIAL/PLATELET - Abnormal; Notable for the following components:      Result Value   WBC 13.9 (*)    HCT 47.7 (*)    Neutro Abs 11.7 (*)    Abs Immature Granulocytes 0.33 (*)    All other  components within normal limits  COMPREHENSIVE METABOLIC PANEL - Abnormal; Notable for the following components:   Sodium 131 (*)    CO2 12 (*)    Glucose, Bld 402 (*)    BUN 32 (*)    Creatinine, Ser 1.14 (*)    Calcium 8.5 (*)    Total Protein 8.2 (*)    AST 11 (*)    Total Bilirubin 1.7 (*)    GFR, Estimated 51 (*)    Anion gap 19 (*)    All other components within normal limits  URINALYSIS, ROUTINE W REFLEX MICROSCOPIC - Abnormal; Notable for the following components:   Glucose, UA 500 (*)    Hgb urine dipstick TRACE (*)    Ketones, ur >160 (*)    Protein, ur 30 (*)    All other components within normal limits  BETA-HYDROXYBUTYRIC ACID - Abnormal; Notable for the following components:   Beta-Hydroxybutyric Acid >8.00 (*)    All other components within normal limits  LACTIC  ACID, PLASMA - Abnormal; Notable for the following components:   Lactic Acid, Venous 3.0 (*)    All other components within normal limits  BLOOD GAS, VENOUS - Abnormal; Notable for the following components:   pH, Ven 7.03 (*)    pCO2, Ven 41 (*)    Bicarbonate 10.8 (*)    Acid-base deficit 19.5 (*)    All other components within normal limits  URINALYSIS, MICROSCOPIC (REFLEX) - Abnormal; Notable for the following components:   Bacteria, UA RARE (*)    All other components within normal limits  CBG MONITORING, ED - Abnormal; Notable for the following components:   Glucose-Capillary 343 (*)    All other components within normal limits  CULTURE, BLOOD (SINGLE)  LIPASE, BLOOD  LACTIC ACID, PLASMA  BASIC METABOLIC PANEL  BASIC METABOLIC PANEL  BASIC METABOLIC PANEL  BETA-HYDROXYBUTYRIC ACID  BETA-HYDROXYBUTYRIC ACID  BASIC METABOLIC PANEL     EKG Sinus tachycardia, VR 117. PR 150, QRS 82, QTc 460. No acute ST elevations. Non specific St changes.    RADIOLOGY CT Head: NAICA CT A/P: Bibasilar tree-in-bud nodularity, no acute abd process   I also independently reviewed and agree with  radiologist interpretations.   PROCEDURES:  Critical Care performed: Yes, see critical care procedure note(s)  .Critical Care  Performed by: Shaune Pollack, MD Authorized by: Shaune Pollack, MD   Critical care provider statement:    Critical care time (minutes):  30   Critical care was necessary to treat or prevent imminent or life-threatening deterioration of the following conditions:  Cardiac failure, circulatory failure, respiratory failure and metabolic crisis   Critical care was time spent personally by me on the following activities:  Development of treatment plan with patient or surrogate, discussions with consultants, evaluation of patient's response to treatment, examination of patient, ordering and review of laboratory studies, ordering and review of radiographic studies, ordering and performing treatments and interventions, pulse oximetry, re-evaluation of patient's condition and review of old charts     MEDICATIONS ORDERED IN ED: Medications  0.9 %  sodium chloride infusion ( Intravenous Canceled Entry 04/14/23 2245)  insulin regular, human (MYXREDLIN) 100 units/ 100 mL infusion (10.5 Units/hr Intravenous New Bag/Given 04/14/23 2303)  lactated ringers infusion ( Intravenous New Bag/Given 04/14/23 2154)  dextrose 5 % in lactated ringers infusion (has no administration in time range)  dextrose 50 % solution 0-50 mL (has no administration in time range)  potassium chloride 10 mEq in 100 mL IVPB (10 mEq Intravenous New Bag/Given 04/14/23 2300)  cefTRIAXone (ROCEPHIN) 2 g in sodium chloride 0.9 % 100 mL IVPB (has no administration in time range)  azithromycin (ZITHROMAX) 500 mg in sodium chloride 0.9 % 250 mL IVPB (has no administration in time range)  sodium chloride 0.9 % bolus 1,000 mL (0 mLs Intravenous Stopped 04/14/23 2140)  lactated ringers bolus 500 mL (0 mLs Intravenous Stopped 04/14/23 2235)  iohexol (OMNIPAQUE) 300 MG/ML solution 100 mL (100 mLs Intravenous Contrast Given  04/14/23 2146)     IMPRESSION / MDM / ASSESSMENT AND PLAN / ED COURSE  I reviewed the triage vital signs and the nursing notes.                              Differential diagnosis includes, but is not limited to, DKA, intra-abd pathology such as enteritis, diverticulitis, obstruction, UTI, PNA, atypical infection, COVID  Patient's presentation is most consistent with acute presentation with potential threat to  life or bodily function.  The patient is on the cardiac monitor to evaluate for evidence of arrhythmia and/or significant heart rate changes  72 yo F here with acute n/v, SOB. Suspect DKA, possibly from underlying intra-abd infection, UTI. Pt mentating well and VS stable outside of tachycardia. IVF given.  Labs show DKA with CO2 12, AG 19, pH 7.0. IVF, IV insulin started with glucostabilizer. Mild likely pre-renal AKi noted. CBC with reactive leukocytosis. CT A/P obtained, reviewed, shows possibel atypical lung infection but o/w is normal.  Will tx for possible CAP, DKA, admit to medicine.    FINAL CLINICAL IMPRESSION(S) / ED DIAGNOSES   Final diagnoses:  Diabetic ketoacidosis without coma associated with type 2 diabetes mellitus (HCC)  Community acquired pneumonia, unspecified laterality     Rx / DC Orders   ED Discharge Orders     None        Note:  This document was prepared using Dragon voice recognition software and may include unintentional dictation errors.   Shaune Pollack, MD 04/14/23 2322

## 2023-04-14 NOTE — ED Triage Notes (Signed)
Patient brought in via medic for n/v, patient reports blood glucose read "high" on Thursday so she took insulin and has not checked blood glucose since. Medic reports blood glucose read 402 at upon arrival and gave 1 liter NS, blood glucose came down to 338.

## 2023-04-15 ENCOUNTER — Encounter: Payer: Self-pay | Admitting: Internal Medicine

## 2023-04-15 DIAGNOSIS — J189 Pneumonia, unspecified organism: Secondary | ICD-10-CM | POA: Diagnosis not present

## 2023-04-15 DIAGNOSIS — A419 Sepsis, unspecified organism: Secondary | ICD-10-CM

## 2023-04-15 DIAGNOSIS — R6520 Severe sepsis without septic shock: Secondary | ICD-10-CM

## 2023-04-15 DIAGNOSIS — E1110 Type 2 diabetes mellitus with ketoacidosis without coma: Secondary | ICD-10-CM | POA: Diagnosis not present

## 2023-04-15 LAB — COMPREHENSIVE METABOLIC PANEL
ALT: 8 U/L (ref 0–44)
AST: 13 U/L — ABNORMAL LOW (ref 15–41)
Albumin: 3.3 g/dL — ABNORMAL LOW (ref 3.5–5.0)
Alkaline Phosphatase: 81 U/L (ref 38–126)
Anion gap: 11 (ref 5–15)
BUN: 24 mg/dL — ABNORMAL HIGH (ref 8–23)
CO2: 14 mmol/L — ABNORMAL LOW (ref 22–32)
Calcium: 8.3 mg/dL — ABNORMAL LOW (ref 8.9–10.3)
Chloride: 110 mmol/L (ref 98–111)
Creatinine, Ser: 0.9 mg/dL (ref 0.44–1.00)
GFR, Estimated: >60 mL/min (ref >60)
Glucose, Bld: 214 mg/dL — ABNORMAL HIGH (ref 70–99)
Potassium: 3.8 mmol/L (ref 3.5–5.1)
Sodium: 135 mmol/L (ref 135–145)
Total Bilirubin: 1.2 mg/dL (ref 0.3–1.2)
Total Protein: 7.4 g/dL (ref 6.5–8.1)

## 2023-04-15 LAB — RESP PANEL BY RT-PCR (RSV, FLU A&B, COVID)  RVPGX2
Influenza A by PCR: NEGATIVE
Influenza B by PCR: NEGATIVE
Resp Syncytial Virus by PCR: NEGATIVE
SARS Coronavirus 2 by RT PCR: NEGATIVE

## 2023-04-15 LAB — BASIC METABOLIC PANEL
Anion gap: 11 (ref 5–15)
Anion gap: 14 (ref 5–15)
Anion gap: 20 — ABNORMAL HIGH (ref 5–15)
BUN: 21 mg/dL (ref 8–23)
BUN: 26 mg/dL — ABNORMAL HIGH (ref 8–23)
BUN: 29 mg/dL — ABNORMAL HIGH (ref 8–23)
CO2: 12 mmol/L — ABNORMAL LOW (ref 22–32)
CO2: 15 mmol/L — ABNORMAL LOW (ref 22–32)
CO2: 9 mmol/L — ABNORMAL LOW (ref 22–32)
Calcium: 8.2 mg/dL — ABNORMAL LOW (ref 8.9–10.3)
Calcium: 8.2 mg/dL — ABNORMAL LOW (ref 8.9–10.3)
Calcium: 8.3 mg/dL — ABNORMAL LOW (ref 8.9–10.3)
Chloride: 104 mmol/L (ref 98–111)
Chloride: 108 mmol/L (ref 98–111)
Chloride: 108 mmol/L (ref 98–111)
Creatinine, Ser: 0.89 mg/dL (ref 0.44–1.00)
Creatinine, Ser: 0.96 mg/dL (ref 0.44–1.00)
Creatinine, Ser: 1.09 mg/dL — ABNORMAL HIGH (ref 0.44–1.00)
GFR, Estimated: 54 mL/min — ABNORMAL LOW (ref >60)
GFR, Estimated: >60 mL/min (ref >60)
GFR, Estimated: >60 mL/min (ref >60)
Glucose, Bld: 151 mg/dL — ABNORMAL HIGH (ref 70–99)
Glucose, Bld: 212 mg/dL — ABNORMAL HIGH (ref 70–99)
Glucose, Bld: 358 mg/dL — ABNORMAL HIGH (ref 70–99)
Potassium: 3.6 mmol/L (ref 3.5–5.1)
Potassium: 3.9 mmol/L (ref 3.5–5.1)
Potassium: 4.4 mmol/L (ref 3.5–5.1)
Sodium: 133 mmol/L — ABNORMAL LOW (ref 135–145)
Sodium: 134 mmol/L — ABNORMAL LOW (ref 135–145)
Sodium: 134 mmol/L — ABNORMAL LOW (ref 135–145)

## 2023-04-15 LAB — GLUCOSE, CAPILLARY
Glucose-Capillary: 168 mg/dL — ABNORMAL HIGH (ref 70–99)
Glucose-Capillary: 127 mg/dL — ABNORMAL HIGH (ref 70–99)
Glucose-Capillary: 105 mg/dL — ABNORMAL HIGH (ref 70–99)
Glucose-Capillary: 128 mg/dL — ABNORMAL HIGH (ref 70–99)
Glucose-Capillary: 180 mg/dL — ABNORMAL HIGH (ref 70–99)
Glucose-Capillary: 190 mg/dL — ABNORMAL HIGH (ref 70–99)
Glucose-Capillary: 214 mg/dL — ABNORMAL HIGH (ref 70–99)
Glucose-Capillary: 171 mg/dL — ABNORMAL HIGH (ref 70–99)
Glucose-Capillary: 137 mg/dL — ABNORMAL HIGH (ref 70–99)
Glucose-Capillary: 176 mg/dL — ABNORMAL HIGH (ref 70–99)
Glucose-Capillary: 179 mg/dL — ABNORMAL HIGH (ref 70–99)
Glucose-Capillary: 218 mg/dL — ABNORMAL HIGH (ref 70–99)
Glucose-Capillary: 206 mg/dL — ABNORMAL HIGH (ref 70–99)
Glucose-Capillary: 217 mg/dL — ABNORMAL HIGH (ref 70–99)

## 2023-04-15 LAB — MAGNESIUM: Magnesium: 1.9 mg/dL (ref 1.7–2.4)

## 2023-04-15 LAB — CBC WITH DIFFERENTIAL/PLATELET
Abs Immature Granulocytes: 0.23 10*3/uL — ABNORMAL HIGH (ref 0.00–0.07)
Basophils Absolute: 0.1 10*3/uL (ref 0.0–0.1)
Basophils Relative: 1 %
Eosinophils Absolute: 0 10*3/uL (ref 0.0–0.5)
Eosinophils Relative: 0 %
HCT: 44.3 % (ref 36.0–46.0)
Hemoglobin: 13.9 g/dL (ref 12.0–15.0)
Immature Granulocytes: 2 %
Lymphocytes Relative: 7 %
Lymphs Abs: 0.8 10*3/uL (ref 0.7–4.0)
MCH: 30.1 pg (ref 26.0–34.0)
MCHC: 31.4 g/dL (ref 30.0–36.0)
MCV: 95.9 fL (ref 80.0–100.0)
Monocytes Absolute: 1 10*3/uL (ref 0.1–1.0)
Monocytes Relative: 9 %
Neutro Abs: 9.6 10*3/uL — ABNORMAL HIGH (ref 1.7–7.7)
Neutrophils Relative %: 81 %
Platelets: 278 10*3/uL (ref 150–400)
RBC: 4.62 MIL/uL (ref 3.87–5.11)
RDW: 13.3 % (ref 11.5–15.5)
WBC: 11.7 10*3/uL — ABNORMAL HIGH (ref 4.0–10.5)
nRBC: 0 % (ref 0.0–0.2)

## 2023-04-15 LAB — CBG MONITORING, ED
Glucose-Capillary: 192 mg/dL — ABNORMAL HIGH (ref 70–99)
Glucose-Capillary: 279 mg/dL — ABNORMAL HIGH (ref 70–99)

## 2023-04-15 LAB — PHOSPHORUS: Phosphorus: 1.8 mg/dL — ABNORMAL LOW (ref 2.5–4.6)

## 2023-04-15 LAB — LACTIC ACID, PLASMA
Lactic Acid, Venous: 1.6 mmol/L (ref 0.5–1.9)
Lactic Acid, Venous: 0.9 mmol/L (ref 0.5–1.9)

## 2023-04-15 LAB — BETA-HYDROXYBUTYRIC ACID
Beta-Hydroxybutyric Acid: 4.05 mmol/L — ABNORMAL HIGH (ref 0.05–0.27)
Beta-Hydroxybutyric Acid: 4.2 mmol/L — ABNORMAL HIGH (ref 0.05–0.27)
Beta-Hydroxybutyric Acid: >8 mmol/L — ABNORMAL HIGH (ref 0.05–0.27)

## 2023-04-15 LAB — MRSA NEXT GEN BY PCR, NASAL: MRSA by PCR Next Gen: NOT DETECTED

## 2023-04-15 LAB — HEMOGLOBIN A1C
Hgb A1c MFr Bld: 11 % — ABNORMAL HIGH (ref 4.8–5.6)
Mean Plasma Glucose: 269 mg/dL

## 2023-04-15 MED ORDER — AZITHROMYCIN 500 MG PO TABS
500.0000 mg | ORAL_TABLET | Freq: Every day | ORAL | Status: AC
  Administered 2023-04-16 – 2023-04-19 (×4): 500 mg via ORAL
  Filled 2023-04-15 (×4): qty 1

## 2023-04-15 MED ORDER — POTASSIUM & SODIUM PHOSPHATES 280-160-250 MG PO PACK
1.0000 | PACK | Freq: Three times a day (TID) | ORAL | Status: AC
  Administered 2023-04-15 (×3): 1 via ORAL
  Filled 2023-04-15 (×3): qty 1

## 2023-04-15 MED ORDER — BISACODYL 5 MG PO TBEC
5.0000 mg | DELAYED_RELEASE_TABLET | Freq: Once | ORAL | Status: AC
  Administered 2023-04-15: 5 mg via ORAL
  Filled 2023-04-15: qty 1

## 2023-04-15 MED ORDER — PANTOPRAZOLE SODIUM 40 MG PO TBEC
40.0000 mg | DELAYED_RELEASE_TABLET | Freq: Two times a day (BID) | ORAL | Status: DC
  Administered 2023-04-15 – 2023-04-19 (×8): 40 mg via ORAL
  Filled 2023-04-15 (×8): qty 1

## 2023-04-15 MED ORDER — INSULIN ASPART 100 UNIT/ML IJ SOLN
0.0000 [IU] | Freq: Three times a day (TID) | INTRAMUSCULAR | Status: DC
  Administered 2023-04-15: 3 [IU] via SUBCUTANEOUS
  Administered 2023-04-15: 2 [IU] via SUBCUTANEOUS
  Administered 2023-04-16: 5 [IU] via SUBCUTANEOUS
  Administered 2023-04-16: 1 [IU] via SUBCUTANEOUS
  Administered 2023-04-16: 3 [IU] via SUBCUTANEOUS
  Administered 2023-04-17 (×2): 2 [IU] via SUBCUTANEOUS
  Administered 2023-04-17 – 2023-04-18 (×2): 3 [IU] via SUBCUTANEOUS
  Administered 2023-04-18: 1 [IU] via SUBCUTANEOUS
  Administered 2023-04-18: 9 [IU] via SUBCUTANEOUS
  Administered 2023-04-19: 5 [IU] via SUBCUTANEOUS
  Administered 2023-04-19: 3 [IU] via SUBCUTANEOUS
  Filled 2023-04-15 (×13): qty 1

## 2023-04-15 MED ORDER — LACTATED RINGERS IV BOLUS
1000.0000 mL | Freq: Once | INTRAVENOUS | Status: AC
  Administered 2023-04-15: 1000 mL via INTRAVENOUS

## 2023-04-15 MED ORDER — PANTOPRAZOLE SODIUM 40 MG IV SOLR
40.0000 mg | Freq: Two times a day (BID) | INTRAVENOUS | Status: DC
  Administered 2023-04-15 (×2): 40 mg via INTRAVENOUS
  Filled 2023-04-15 (×2): qty 10

## 2023-04-15 MED ORDER — LACTATED RINGERS IV BOLUS: 500.0000 mL | Freq: Once | INTRAVENOUS | Status: DC

## 2023-04-15 MED ORDER — CHLORHEXIDINE GLUCONATE CLOTH 2 % EX PADS
6.0000 | MEDICATED_PAD | Freq: Every day | CUTANEOUS | Status: DC
  Administered 2023-04-15 – 2023-04-16 (×2): 6 via TOPICAL

## 2023-04-15 MED ORDER — SENNA 8.6 MG PO TABS
1.0000 | ORAL_TABLET | Freq: Every day | ORAL | Status: DC
  Administered 2023-04-16 – 2023-04-19 (×4): 8.6 mg via ORAL
  Filled 2023-04-15 (×4): qty 1

## 2023-04-15 MED ORDER — INSULIN ASPART 100 UNIT/ML IJ SOLN
0.0000 [IU] | Freq: Every day | INTRAMUSCULAR | Status: DC
  Administered 2023-04-15: 2 [IU] via SUBCUTANEOUS
  Administered 2023-04-18: 4 [IU] via SUBCUTANEOUS
  Filled 2023-04-15 (×2): qty 1

## 2023-04-15 MED ORDER — INSULIN GLARGINE-YFGN 100 UNIT/ML ~~LOC~~ SOLN
20.0000 [IU] | Freq: Every day | SUBCUTANEOUS | Status: DC
  Administered 2023-04-15 – 2023-04-19 (×5): 20 [IU] via SUBCUTANEOUS
  Filled 2023-04-15 (×5): qty 0.2

## 2023-04-15 MED ORDER — SODIUM BICARBONATE 650 MG PO TABS
650.0000 mg | ORAL_TABLET | Freq: Two times a day (BID) | ORAL | Status: AC
  Administered 2023-04-15 (×2): 650 mg via ORAL
  Filled 2023-04-15 (×2): qty 1

## 2023-04-15 NOTE — Evaluation (Signed)
Physical Therapy Evaluation Patient Details Name: Jasmine Velasquez MRN: [id number] DOB: 05-Aug-1951 Today's Date: 04/15/2023  History of Present Illness  Pt is a 72 y.o. female here with hyperglycemia and SOB. Pt reports that over the past week, she has had progressively worsening nausea, and inability to eat/drink. Over the past 24 hours she has had worsening SOB and feels weak and fatigued. PMHx arthritis, asthma, poorly controlled IDDM, bipolar 1 disorder, breast cancer, glaucoma in R eye, and hypothyroidism.   Clinical Impression  Pt alert, oriented to self, place, time. She was able to report at baseline she is independent, drives to the [employer] store for microwavable meals, denied use of DME. She was able to transition to sitting EOB with minA and fair balance noted. Sit <> stand with RW and minA for steadying, and after a minute was able to step pivot to recliner with minA as well. Further mobility deferred due to IV malfunction and RN notified as well as pt fatigue.  Overall the patient demonstrated deficits (see "PT Problem List") that impede the patient's functional abilities, safety, and mobility and would benefit from skilled PT intervention. Recommendation is to continue skilled PT services to return to PLOF.        Recommendations for follow up therapy are one component of a multi-disciplinary discharge planning process, led by the attending physician.  Recommendations may be updated based on patient status, additional functional criteria and insurance authorization.  Follow Up Recommendations Can patient physically be transported by private vehicle: No     Assistance Recommended at Discharge Frequent or constant Supervision/Assistance  Patient can return home with the following  A lot of help with bathing/dressing/bathroom;A lot of help with walking and/or transfers;Assistance with cooking/housework;Direct supervision/assist for medications management;Assist for transportation;Help  with stairs or ramp for entrance    Equipment Recommendations Rolling walker (2 wheels);BSC/3in1  Recommendations for Other Services       Functional Status Assessment Patient has had a recent decline in their functional status and demonstrates the ability to make significant improvements in function in a reasonable and predictable amount of time.     Precautions / Restrictions Precautions Precautions: Fall Restrictions Weight Bearing Restrictions: No      Mobility  Bed Mobility Overal bed mobility: Needs Assistance Bed Mobility: Supine to Sit     Supine to sit: Min assist, HOB elevated          Transfers Overall transfer level: Needs assistance Equipment used: Rolling walker (2 wheels) Transfers: Sit to/from Stand, Bed to chair/wheelchair/BSC Sit to Stand: Min assist   Step pivot transfers: Min assist            Ambulation/Gait               General Gait Details: deferred due to IV malfunction, pt fatigue  Stairs            Wheelchair Mobility    Modified Rankin (Stroke Patients Only)       Balance Overall balance assessment: Needs assistance Sitting-balance support: Feet supported Sitting balance-Leahy Scale: Fair     Standing balance support: Reliant on assistive device for balance Standing balance-Leahy Scale: Fair                               Pertinent Vitals/Pain Pain Assessment Pain Assessment: No/denies pain    Home Living Family/patient expects to be discharged to:: Private residence Living Arrangements: Alone   Type of Home:  House Home Access: Stairs to enter Entrance Stairs-Rails: Right;Left;Can reach both Secretary/administrator of Steps: 3   Home Layout: One level Home Equipment: None      Prior Function Prior Level of Function : Independent/Modified Independent;Driving             Mobility Comments: pt drives to [employer] to obtain microwave meals       Hand Dominance         Extremity/Trunk Assessment   Upper Extremity Assessment Upper Extremity Assessment: Generalized weakness    Lower Extremity Assessment Lower Extremity Assessment: Generalized weakness    Cervical / Trunk Assessment Cervical / Trunk Assessment: Normal  Communication   Communication: No difficulties  Cognition Arousal/Alertness: Awake/alert Behavior During Therapy: WFL for tasks assessed/performed Overall Cognitive Status: Within Functional Limits for tasks assessed                                          General Comments      Exercises     Assessment/Plan    PT Assessment Patient needs continued PT services  PT Problem List Decreased strength;Decreased mobility;Decreased range of motion;Decreased activity tolerance;Decreased balance;Decreased knowledge of use of DME;Decreased knowledge of precautions;Decreased safety awareness       PT Treatment Interventions DME instruction;Therapeutic activities;Gait training;Therapeutic exercise;Patient/family education;Stair training;Balance training;Functional mobility training;Neuromuscular re-education    PT Goals (Current goals can be found in the Care Plan section)  Acute Rehab PT Goals Patient Stated Goal: to get his strength back PT Goal Formulation: With patient Time For Goal Achievement: 04/29/23 Potential to Achieve Goals: Good    Frequency Min 3X/week     Co-evaluation               AM-PAC PT "6 Clicks" Mobility  Outcome Measure Help needed turning from your back to your side while in a flat bed without using bedrails?: A Little Help needed moving from lying on your back to sitting on the side of a flat bed without using bedrails?: A Little Help needed moving to and from a bed to a chair (including a wheelchair)?: A Little Help needed standing up from a chair using your arms (e.g., wheelchair or bedside chair)?: A Little Help needed to walk in hospital room?: A Lot Help needed climbing 3-5  steps with a railing? : A Lot 6 Click Score: 16    End of Session Equipment Utilized During Treatment: Gait belt Activity Tolerance: Patient tolerated treatment well Patient left: in chair;with call bell/phone within reach Nurse Communication: Mobility status PT Visit Diagnosis: Other abnormalities of gait and mobility (R26.89);Muscle weakness (generalized) (M62.81);Difficulty in walking, not elsewhere classified (R26.2)    Time: 6295-2841 PT Time Calculation (min) (ACUTE ONLY): 14 min   Charges:   PT Evaluation $PT Eval Low Complexity: 1 Low PT Treatments $Therapeutic Activity: 8-22 mins       Olga Coaster PT, DPT 12:53 PM,04/15/23

## 2023-04-15 NOTE — Assessment & Plan Note (Addendum)
Patient started on DKA protocol. Accu-Cheks and blood sugars and titrate insulin per protocol. Will continue monitoring for electrolytes magnesium potassium and phosphorus and replace.

## 2023-04-15 NOTE — Progress Notes (Signed)
OT Cancellation Note  Patient Details Name: Jasmine Velasquez MRN: [id number] DOB: [birth year]   Cancelled Treatment:    Reason Eval/Treat Not Completed: Other (comment). Consult received, chart reviewed. Pt with diabetes coordinator and then with IV team upon attempts. Will re-attempt this afternoon as medically appropriate.   Arman Filter., MPH, MS, OTR/L ascom [phone] 04/15/23, 11:35 AM

## 2023-04-15 NOTE — Progress Notes (Signed)
PHARMACIST - PHYSICIAN COMMUNICATION  CONCERNING: IV to Oral Route Change Policy  RECOMMENDATION: This patient is receiving pantoprazole by the intravenous route.  Based on criteria approved by the Pharmacy and Therapeutics Committee, the intravenous medication(s) is/are being converted to the equivalent oral dose form(s).  DESCRIPTION: These criteria include: The patient is eating (either orally or via tube) and/or has been taking other orally administered medications for a least 24 hours The patient has no evidence of active gastrointestinal bleeding or impaired GI absorption (gastrectomy, short bowel, patient on TNA or NPO).  If you have questions about this conversion, please contact the Pharmacy Department   Tressie Ellis, [facility] 04/15/2023 3:03 PM

## 2023-04-15 NOTE — Assessment & Plan Note (Addendum)
Patient meets severe sepsis criteria with a white count vitals and pneumonia. Continue with additional supportive measures. Continue with IV fluids and follow blood cultures.

## 2023-04-15 NOTE — Progress Notes (Addendum)
Progress Note    Jasmine Velasquez  ZOX:[id number] DOB: 03/04/1951  DOA: 04/14/2023 PCP: Marisue Ivan, MD      Brief Narrative:    Medical records reviewed and are as summarized below:  Jasmine Velasquez is a 72 y.o. female with medical history significant for insulin-dependent diabetes mellitus, bipolar 1 disorder, history of breast cancer, arthritis, hypothyroidism, asthma, glaucoma, history of pneumonia, who presented to the hospital with fatigue, shortness of breath, nausea, vomiting and high blood glucose.  Reportedly, her blood glucose was 102 and EMS gave her a liter of normal saline and glucose came down to 338.   Workup in the ED revealed severe sepsis secondary to pneumonia and DKA.  She was treated with IV insulin infusion, IV fluids and empiric IV antibiotics.        Assessment/Plan:   Principal Problem:   DKA (diabetic ketoacidosis) (HCC) Active Problems:   SOB (shortness of breath)   Acquired hypothyroidism   Type 2 diabetes mellitus with hyperglycemia, with long-term current use of insulin (HCC)   CAP (community acquired pneumonia)   Glaucoma   Severe sepsis (HCC)   Hypophosphatemia    Body mass index is 25.82 kg/m.    Severe sepsis secondary to pneumonia: Continue empiric IV antibiotics.  Follow-up blood cultures.   Type II DM, DKA: Elevated anion gap has improved.  Start Semglee 20 units daily and wean off insulin infusion. Start oral sodium bicarbonate for metabolic acidosis. Hemoglobin A1c was 11.  Chart review shows that patient was taking insulin 70/30 30 units in the morning and 5 units in the evening.  She also has metformin, Januvia and glimepiride on her med list.   Hypophosphatemia: Replete with oral potassium phosphate and monitor levels.   Glaucoma: Continue latanoprost eyedrops   Other comorbidities include hypothyroidism, asthma   Diet Order             Diet NPO time specified Except for: Sips with Meds  Diet  effective now                            Consultants: None  Procedures: None    Medications:    heparin  5,000 Units Subcutaneous Q8H   insulin glargine-yfgn  20 Units Subcutaneous Daily   latanoprost  1 drop Right Eye QHS   pantoprazole (PROTONIX) IV  40 mg Intravenous Q12H   potassium & sodium phosphates  1 packet Oral TID WC & HS   sodium bicarbonate  650 mg Oral BID   Continuous Infusions:  sodium chloride     azithromycin 500 mg (04/15/23 0307)   cefTRIAXone (ROCEPHIN)  IV 2 g (04/15/23 0229)   dextrose 5% lactated ringers 125 mL/hr at 04/15/23 0119   insulin 1.8 Units/hr (04/15/23 1116)     Anti-infectives (From admission, onward)    Start     Dose/Rate Route Frequency Ordered Stop   04/15/23 0200  cefTRIAXone (ROCEPHIN) 2 g in sodium chloride 0.9 % 100 mL IVPB        2 g 200 mL/hr over 30 Minutes Intravenous Daily 04/14/23 2324 04/20/23 0559   04/15/23 0200  azithromycin (ZITHROMAX) 500 mg in sodium chloride 0.9 % 250 mL IVPB        500 mg 250 mL/hr over 60 Minutes Intravenous Daily 04/14/23 2324 04/20/23 0559   04/14/23 2230  cefTRIAXone (ROCEPHIN) 2 g in sodium chloride 0.9 % 100 mL IVPB  Status:  Discontinued  2 g 200 mL/hr over 30 Minutes Intravenous  Once 04/14/23 2215 04/15/23 0144   04/14/23 2230  azithromycin (ZITHROMAX) 500 mg in sodium chloride 0.9 % 250 mL IVPB  Status:  Discontinued        500 mg 250 mL/hr over 60 Minutes Intravenous  Once 04/14/23 2215 04/15/23 0144              Family Communication/Anticipated D/C date and plan/Code Status   DVT prophylaxis: heparin injection 5,000 Units Start: 04/15/23 0200     Code Status: Full Code  Family Communication: None Disposition Plan: Plan to discharge home in 1 to 2 days   Status is: Inpatient Remains inpatient appropriate because: DKA and sepsis on IV antibiotics       Subjective:   Interval events noted.  She feels better today.  She has a cough but no  shortness of breath or chest pain.  No nausea, abdominal pain, vomiting or diarrhea.  Objective:    Vitals:   04/15/23 0800 04/15/23 0900 04/15/23 1000 04/15/23 1100  BP: 100/78 122/63    Pulse: 93 98 99 (!) 102  Resp: (!) 41 (!) 27 (!) 25 (!) 32  Temp:      TempSrc:      SpO2: 95% 96% 94% 95%  Weight:      Height:       No data found.   Intake/Output Summary (Last 24 hours) at 04/15/2023 1146 Last data filed at 04/15/2023 6045 Gross per 24 hour  Intake 1400.14 ml  Output --  Net 1400.14 ml   Filed Weights   04/14/23 2003  Weight: 72.6 kg    Exam:  GEN: NAD SKIN: Warm and dry EYES: Anicteric ENT: MMM CV: RRR PULM: Air entry adequate bilaterally.  Bilateral rhonchi at the lung bases. ABD: soft, ND, NT, +BS CNS: AAO x 3, non focal EXT: No edema or tenderness        Data Reviewed:   I have personally reviewed following labs and imaging studies:  Labs: Labs show the following:   Basic Metabolic Panel: Recent Labs  Lab 04/14/23 2025 04/14/23 2342 04/15/23 0306 04/15/23 0711  NA 131* 133* 134* 135  K 4.3 4.4 3.9 3.8  CL 100 104 108 110  CO2 12* 9* 12* 14*  GLUCOSE 402* 358* 151* 214*  BUN 32* 29* 26* 24*  CREATININE 1.14* 1.09* 0.96 0.90  CALCIUM 8.5* 8.2* 8.3* 8.3*  MG  --   --   --  1.9  PHOS  --   --   --  1.8*   GFR Estimated Creatinine Clearance: 56.8 mL/min (by C-G formula based on SCr of 0.9 mg/dL). Liver Function Tests: Recent Labs  Lab 04/14/23 2025 04/15/23 0711  AST 11* 13*  ALT 9 8  ALKPHOS 97 81  BILITOT 1.7* 1.2  PROT 8.2* 7.4  ALBUMIN 3.7 3.3*   Recent Labs  Lab 04/14/23 2025  LIPASE 26   No results for input(s): "AMMONIA" in the last 168 hours. Coagulation profile No results for input(s): "INR", "PROTIME" in the last 168 hours.  CBC: Recent Labs  Lab 04/14/23 2025 04/15/23 0711  WBC 13.9* 11.7*  NEUTROABS 11.7* 9.6*  HGB 14.7 13.9  HCT 47.7* 44.3  MCV 97.9 95.9  PLT 343 278   Cardiac Enzymes: No  results for input(s): "CKTOTAL", "CKMB", "CKMBINDEX", "TROPONINI" in the last 168 hours. BNP (last 3 results) No results for input(s): "PROBNP" in the last 8760 hours. CBG: Recent Labs  Lab  04/15/23 0654 04/15/23 0802 04/15/23 0904 04/15/23 0955 04/15/23 1108  GLUCAP 190* 214* 171* 137* 176*   D-Dimer: No results for input(s): "DDIMER" in the last 72 hours. Hgb A1c: Recent Labs    04/14/23 2024  HGBA1C 11.0*   Lipid Profile: No results for input(s): "CHOL", "HDL", "LDLCALC", "TRIG", "CHOLHDL", "LDLDIRECT" in the last 72 hours. Thyroid function studies: No results for input(s): "TSH", "T4TOTAL", "T3FREE", "THYROIDAB" in the last 72 hours.  Invalid input(s): "FREET3" Anemia work up: No results for input(s): "VITAMINB12", "FOLATE", "FERRITIN", "TIBC", "IRON", "RETICCTPCT" in the last 72 hours. Sepsis Labs: Recent Labs  Lab 04/14/23 2025 04/14/23 2256 04/15/23 0424 04/15/23 0711  WBC 13.9*  --   --  11.7*  LATICACIDVEN 1.7 3.0* 1.6 0.9    Microbiology Recent Results (from the past 240 hour(s))  Blood culture (single)     Status: None (Preliminary result)   Collection Time: 04/14/23  8:25 PM   Specimen: BLOOD  Result Value Ref Range Status   Specimen Description BLOOD BLOOD RIGHT HAND  Final   Special Requests   Final    BOTTLES DRAWN AEROBIC AND ANAEROBIC Blood Culture results may not be optimal due to an inadequate volume of blood received in culture bottles   Culture   Final    NO GROWTH < 12 HOURS Performed at [facility], [address], [place], [place] [address]    Report Status PENDING  Incomplete  Resp panel by RT-PCR (RSV, Flu A&B, Covid) Anterior Nasal Swab     Status: None   Collection Time: 04/15/23  1:45 AM   Specimen: Anterior Nasal Swab  Result Value Ref Range Status   SARS Coronavirus 2 by RT PCR NEGATIVE NEGATIVE Final    Comment: (NOTE) SARS-CoV-2 target nucleic acids are NOT DETECTED.  The SARS-CoV-2 RNA is generally detectable  in upper respiratory specimens during the acute phase of infection. The lowest concentration of SARS-CoV-2 viral copies this assay can detect is 138 copies/mL. A negative result does not preclude SARS-Cov-2 infection and should not be used as the sole basis for treatment or other patient management decisions. A negative result may occur with  improper specimen collection/handling, submission of specimen other than nasopharyngeal swab, presence of viral mutation(s) within the areas targeted by this assay, and inadequate number of viral copies(<138 copies/mL). A negative result must be combined with clinical observations, patient history, and epidemiological information. The expected result is Negative.  Fact Sheet for Patients:  BloggerCourse.com  Fact Sheet for Healthcare Providers:  SeriousBroker.it  This test is no t yet approved or cleared by the [place] FDA and  has been authorized for detection and/or diagnosis of SARS-CoV-2 by FDA under an Emergency Use Authorization (EUA). This EUA will remain  in effect (meaning this test can be used) for the duration of the COVID-19 declaration under Section 564(b)(1) of the Act, 21 U.S.C.section 360bbb-3(b)(1), unless the authorization is terminated  or revoked sooner.       Influenza A by PCR NEGATIVE NEGATIVE Final   Influenza B by PCR NEGATIVE NEGATIVE Final    Comment: (NOTE) The Xpert Xpress SARS-CoV-2/FLU/RSV plus assay is intended as an aid in the diagnosis of influenza from Nasopharyngeal swab specimens and should not be used as a sole basis for treatment. Nasal washings and aspirates are unacceptable for Xpert Xpress SARS-CoV-2/FLU/RSV testing.  Fact Sheet for Patients: BloggerCourse.com  Fact Sheet for Healthcare Providers: SeriousBroker.it  This test is not yet approved or cleared by the [place]  FDA and has  been authorized for detection and/or diagnosis of SARS-CoV-2 by FDA under an Emergency Use Authorization (EUA). This EUA will remain in effect (meaning this test can be used) for the duration of the COVID-19 declaration under Section 564(b)(1) of the Act, 21 U.S.C. section 360bbb-3(b)(1), unless the authorization is terminated or revoked.     Resp Syncytial Virus by PCR NEGATIVE NEGATIVE Final    Comment: (NOTE) Fact Sheet for Patients: BloggerCourse.com  Fact Sheet for Healthcare Providers: SeriousBroker.it  This test is not yet approved or cleared by the [place] FDA and has been authorized for detection and/or diagnosis of SARS-CoV-2 by FDA under an Emergency Use Authorization (EUA). This EUA will remain in effect (meaning this test can be used) for the duration of the COVID-19 declaration under Section 564(b)(1) of the Act, 21 U.S.C. section 360bbb-3(b)(1), unless the authorization is terminated or revoked.  Performed at [facility], [address] [place], [place] [address]   MRSA Next Gen by PCR, Nasal     Status: None   Collection Time: 04/15/23  1:45 AM   Specimen: Nasal Mucosa; Nasal Swab  Result Value Ref Range Status   MRSA by PCR Next Gen NOT DETECTED NOT DETECTED Final    Comment: (NOTE) The GeneXpert MRSA Assay (FDA approved for NASAL specimens only), is one component of a comprehensive MRSA colonization surveillance program. It is not intended to diagnose MRSA infection nor to guide or monitor treatment for MRSA infections. Test performance is not FDA approved in patients less than 84 years old. Performed at [facility], [address] [place], [place] [address]     Procedures and diagnostic studies:  CT HEAD WO CONTRAST ( )  Result Date: 04/14/2023 CLINICAL DATA:  Mental status change, unknown cause EXAM: CT HEAD WITHOUT CONTRAST TECHNIQUE: Contiguous axial images were  obtained from the base of the skull through the vertex without intravenous contrast. RADIATION DOSE REDUCTION: This exam was performed according to the departmental dose-optimization program which includes automated exposure control, adjustment of the mA and/or kV according to patient size and/or use of iterative reconstruction technique. COMPARISON:  None Available. FINDINGS: Brain: No evidence of large-territorial acute infarction. No parenchymal hemorrhage. No mass lesion. No extra-axial collection. No mass effect or midline shift. No hydrocephalus. Basilar cisterns are patent. Vascular: No hyperdense vessel. Skull: No acute fracture or focal lesion. Sinuses/Orbits: Paranasal sinuses and mastoid air cells are clear. The orbits are unremarkable. Other: None. IMPRESSION: No acute intracranial abnormality. Electronically Signed   By: Tish Frederickson M.D.   On: 04/14/2023 22:16   CT ABDOMEN PELVIS W CONTRAST  Result Date: 04/14/2023 CLINICAL DATA:  Abdominal pain, acute, nonlocalized EXAM: CT ABDOMEN AND PELVIS WITH CONTRAST TECHNIQUE: Multidetector CT imaging of the abdomen and pelvis was performed using the standard protocol following bolus administration of intravenous contrast. RADIATION DOSE REDUCTION: This exam was performed according to the departmental dose-optimization program which includes automated exposure control, adjustment of the mA and/or kV according to patient size and/or use of iterative reconstruction technique. CONTRAST:  OMNIPAQUE IOHEXOL 300 MG/ML  SOLN COMPARISON:  Chest x-ray 04/14/2023 FINDINGS: Lower chest: Bibasilar vague tree-in-bud nodularity. Hepatobiliary: No focal liver abnormality. Status post cholecystectomy. No biliary dilatation. Pancreas: Diffusely atrophic. No focal lesion. Otherwise normal pancreatic contour. No surrounding inflammatory changes. No main pancreatic ductal dilatation. Spleen: Normal in size without focal abnormality. Adrenals/Urinary Tract: No adrenal  nodule bilaterally. Bilateral kidneys enhance symmetrically. No hydronephrosis. No hydroureter. The urinary bladder is unremarkable.  On delayed imaging, there is no urothelial wall thickening and there are no filling defects in the opacified portions of the bilateral collecting systems or ureters. Stomach/Bowel: Stomach is within normal limits. No evidence of bowel wall thickening or dilatation. The appendix is not definitely identified with no inflammatory changes in the right lower quadrant to suggest acute appendicitis. Vascular/Lymphatic: No abdominal aorta or iliac aneurysm. Mild to moderate atherosclerotic plaque of the aorta and its branches. No abdominal, pelvic, or inguinal lymphadenopathy. Reproductive: Status post hysterectomy. No adnexal masses. Other: No intraperitoneal free fluid. No intraperitoneal free gas. No organized fluid collection. Musculoskeletal: Tiny fat containing umbilical hernia. No suspicious lytic or blastic osseous lesions. No acute displaced fracture. IMPRESSION: 1. Bibasilar vague tree-in-bud nodularity. Findings suggestive of atypical infection. No follow-up needed if patient is low-risk (and has no known or suspected primary neoplasm). Non-contrast chest CT can be considered in 12 months if patient is high-risk. This recommendation follows the consensus statement: Guidelines for Management of Incidental Pulmonary Nodules Detected on CT Images: From the Fleischner Society 2017; Radiology 2017; 284:228-243. 2. No acute intra-abdominal or intrapelvic abnormality. 3. Aortic Atherosclerosis (ICD10-I70.0). Electronically Signed   By: Tish Frederickson M.D.   On: 04/14/2023 22:02   DG Chest Portable 1 View  Result Date: 04/14/2023 CLINICAL DATA:  Shortness of breath EXAM: PORTABLE CHEST 1 VIEW COMPARISON:  11/28/2011 FINDINGS: Cardiac shadow is within normal limits. Aortic calcifications are seen. Lungs are well aerated bilaterally. Mild increased opacity is noted in the right lung base  consistent with developing infiltrate. No bony abnormality is noted. IMPRESSION: Increasing airspace opacity in the right base. Electronically Signed   By: Alcide Clever M.D.   On: 04/14/2023 21:49               LOS: 1 day   Seleny Allbright  [facility]   Pager on [email or web address]. If 7PM-7AM, please contact night-coverage at [email or web address]     04/15/2023, 11:46 AM

## 2023-04-15 NOTE — Assessment & Plan Note (Addendum)
Patient has a history of diabetes mellitus type 2 and has been insulin requiring and has been poorly controlled per primary care note.  Home regimen consists of Amaryl, Glucophage, Januvia. Patient has room to increase her Januvia dose upon discharge. Patient will most likely require insulin teaching and long-acting insulin for optimizing blood sugar. Will obtain an A1c.

## 2023-04-15 NOTE — Assessment & Plan Note (Addendum)
Continue patient on her latanoprost in her right eye.

## 2023-04-15 NOTE — Evaluation (Signed)
Occupational Therapy Evaluation Patient Details Name: Jasmine Velasquez MRN: [id number] DOB: 1951/11/20 Today's Date: 04/15/2023   History of Present Illness Pt is a 72 y.o. female here with hyperglycemia and SOB. Pt reports that over the past week, she has had progressively worsening nausea, and inability to eat/drink. Over the past 24 hours she has had worsening SOB and feels weak and fatigued. PMHx arthritis, asthma, poorly controlled IDDM, bipolar 1 disorder, breast cancer, glaucoma in R eye, and hypothyroidism.   Clinical Impression   Pt was seen for OT evaluation this date. Prior to hospital admission, pt reports being independent, driving herself, and preparing simple microwave meals. Pt lives alone in a home with 3STE and bilat rails. No family present. Pt presents to acute OT demonstrating impaired ADL performance and functional mobility 2/2 decreased strength, activity tolerance, and balance (See OT problem list). Pt currently requires MIN A for bed mobility, ADL transfers with RW, and LB ADL tasks. Pt would benefit from additional skilled OT services to address noted impairments and functional limitations (see below for any additional details) in order to maximize safety and independence while minimizing falls risk and caregiver burden.     Recommendations for follow up therapy are one component of a multi-disciplinary discharge planning process, led by the attending physician.  Recommendations may be updated based on patient status, additional functional criteria and insurance authorization.   Assistance Recommended at Discharge Frequent or constant Supervision/Assistance  Patient can return home with the following A little help with walking and/or transfers;A little help with bathing/dressing/bathroom;Assistance with cooking/housework;Assist for transportation;Help with stairs or ramp for entrance    Functional Status Assessment  Patient has had a recent decline in their functional  status and demonstrates the ability to make significant improvements in function in a reasonable and predictable amount of time.  Equipment Recommendations  BSC/3in1    Recommendations for Other Services       Precautions / Restrictions Precautions Precautions: Fall Restrictions Weight Bearing Restrictions: No      Mobility Bed Mobility Overal bed mobility: Needs Assistance Bed Mobility: Supine to Sit, Sit to Supine     Supine to sit: Min assist, HOB elevated Sit to supine: Min guard        Transfers Overall transfer level: Needs assistance Equipment used: Rolling walker (2 wheels) Transfers: Sit to/from Stand Sit to Stand: Min assist                  Balance Overall balance assessment: Needs assistance Sitting-balance support: Feet supported Sitting balance-Leahy Scale: Fair     Standing balance support: Reliant on assistive device for balance Standing balance-Leahy Scale: Fair                             ADL either performed or assessed with clinical judgement   ADL                                         General ADL Comments: Pt able to manage socks using figure 4 technique with supv from seated position, CGA-MIN A for LB ADL tasks involving ADL transfers or dynamic balance. MIN A for ADL transfers with RW.     Vision         Perception     Praxis      Pertinent Vitals/Pain Pain Assessment Pain Assessment: No/denies  pain     Hand Dominance     Extremity/Trunk Assessment Upper Extremity Assessment Upper Extremity Assessment: Generalized weakness   Lower Extremity Assessment Lower Extremity Assessment: Generalized weakness   Cervical / Trunk Assessment Cervical / Trunk Assessment: Normal   Communication Communication Communication: No difficulties   Cognition Arousal/Alertness: Awake/alert Behavior During Therapy: WFL for tasks assessed/performed Overall Cognitive Status: Within Functional Limits for  tasks assessed                                       General Comments       Exercises Other Exercises Other Exercises: Pt educated in activity pacing in setting of ADL and mobility to maximize safety.   Shoulder Instructions      Home Living Family/patient expects to be discharged to:: Private residence Living Arrangements: Alone   Type of Home: House Home Access: Stairs to enter Secretary/administrator of Steps: 3 Entrance Stairs-Rails: Right;Left;Can reach both Home Layout: One level               Home Equipment: None          Prior Functioning/Environment Prior Level of Function : Independent/Modified Independent;Driving             Mobility Comments: pt drives to [employer] to obtain microwave meals          OT Problem List: Decreased strength;Decreased activity tolerance;Impaired balance (sitting and/or standing);Decreased knowledge of use of DME or AE      OT Treatment/Interventions: Self-care/ADL training;Therapeutic exercise;Therapeutic activities;DME and/or AE instruction;Energy conservation;Patient/family education;Balance training    OT Goals(Current goals can be found in the care plan section) Acute Rehab OT Goals Patient Stated Goal: get better and go home OT Goal Formulation: With patient Time For Goal Achievement: 04/29/23 Potential to Achieve Goals: Good ADL Goals Pt Will Perform Lower Body Dressing: sit to/from stand;with supervision Pt Will Transfer to Toilet: with supervision;ambulating (LRAD) Pt Will Perform Toileting - Clothing Manipulation and hygiene: with modified independence Additional ADL Goal #1: Pt will complete all aspects of bathing with set up and supv from sitting/standing, 2/2 opportunities, VSS, no LOB. Additional ADL Goal #2: Pt will verbalize plan to implement at least 1 learned falls prevention strategy.  OT Frequency: Min 2X/week    Co-evaluation              AM-PAC OT "6 Clicks" Daily  Activity     Outcome Measure Help from another person eating meals?: None Help from another person taking care of personal grooming?: A Little Help from another person toileting, which includes using toliet, bedpan, or urinal?: A Little Help from another person bathing (including washing, rinsing, drying)?: A Little Help from another person to put on and taking off regular upper body clothing?: None Help from another person to put on and taking off regular lower body clothing?: A Little 6 Click Score: 20   End of Session Equipment Utilized During Treatment: Rolling walker (2 wheels) Nurse Communication: Mobility status  Activity Tolerance: Patient tolerated treatment well Patient left: in bed;with call bell/phone within reach;with bed alarm set  OT Visit Diagnosis: Other abnormalities of gait and mobility (R26.89);Muscle weakness (generalized) (M62.81)                Time: 1610-9604 OT Time Calculation (min): 15 min Charges:  OT General Charges $OT Visit: 1 Visit OT Evaluation $OT Eval Low Complexity: 1 Low  Arman Filter., MPH, MS, OTR/L ascom [phone] 04/15/23, 3:35 PM

## 2023-04-15 NOTE — Progress Notes (Signed)
PHARMACIST - PHYSICIAN COMMUNICATION  CONCERNING: Antibiotic IV to Oral Route Change Policy  RECOMMENDATION: This patient is receiving azithromycin by the intravenous route.  Based on criteria approved by the Pharmacy and Therapeutics Committee, the antibiotic(s) is/are being converted to the equivalent oral dose form(s).  DESCRIPTION: These criteria include: Patient being treated for a respiratory tract infection, urinary tract infection, cellulitis or clostridium difficile associated diarrhea if on metronidazole The patient is not neutropenic and does not exhibit a GI malabsorption state The patient is eating (either orally or via tube) and/or has been taking other orally administered medications for a least 24 hours The patient is improving clinically and has a Tmax < 100.5  If you have questions about this conversion, please contact the Pharmacy Department   Mikhael Hendriks B Vivion Romano 04/15/23   

## 2023-04-15 NOTE — Progress Notes (Signed)
       CROSS COVER NOTE  NAME: IDIL GILLOCK MRN: [id number] DOB : [birth year]    HPI/Events of Note   Report:pt states last BM was 6 days ago. she comes in with DKA and promotes poor po intake   On review of chart: Severe sepsis secondary to pneumonia and DKA    Assessment and  Interventions   Assessment: Constipation  Plan: Dulcolax p.o. tonight for moderate constipation and daily stool softener X X

## 2023-04-15 NOTE — Inpatient Diabetes Management (Addendum)
Inpatient Diabetes Program Recommendations  AACE/ADA: New Consensus Statement on Inpatient Glycemic Control (2015)  Target Ranges:  Prepandial:   less than 140 mg/dL      Peak postprandial:   less than 180 mg/dL (1-2 hours)      Critically ill patients:  140 - 180 mg/dL    Latest Reference Range & Units 04/14/23 20:24  Hemoglobin A1C 4.8 - 5.6 % 11.0 (H)  269 mg/dl  (H): Data is abnormally high  Latest Reference Range & Units 04/14/23 20:25 04/14/23 23:42  Beta-Hydroxybutyric Acid 0.05 - 0.27 mmol/L >8.00 (H) >8.00 (H)  (H): Data is abnormally high  Latest Reference Range & Units 04/14/23 20:25  Sodium 135 - 145 mmol/L 131 (L)  Potassium 3.5 - 5.1 mmol/L 4.3  Chloride 98 - 111 mmol/L 100  CO2 22 - 32 mmol/L 12 (L)  Glucose 70 - 99 mg/dL 161 (H)  BUN 8 - 23 mg/dL 32 (H)  Creatinine 0.96 - 1.00 mg/dL 0.45 (H)  Calcium 8.9 - 10.3 mg/dL 8.5 (L)  Anion gap 5 - 15  19 (H)  (L): Data is abnormally low (H): Data is abnormally high  Latest Reference Range & Units 04/14/23 22:33 04/15/23 00:01 04/15/23 01:10 04/15/23 01:59 04/15/23 02:58 04/15/23 03:58 04/15/23 04:57 04/15/23 06:00  Glucose-Capillary 70 - 99 mg/dL 409 (H)  IV Insulin Drip Started @2303  279 (H) 192 (H) 168 (H) 127 (H) 105 (H) 128 (H) 180 (H)  IV Insulin Drip Infusing  (H): Data is abnormally high    Admit with: SOB/ Sepsis/ Pnuemonia/ DKA  History: DM2  Home DM Meds: Amaryl 4 mg TID       Januvia 50 mg Daily       Metformin 1000 mg Daily  Current Orders: IV Insulin Drip   PCP: Dr. Burnadette Pop Last seen 12/19/2022 A1c at that visit was 11.8%--Per MD notes, was taking 70/30 Insulin--30 units AM/ 5 units PM  Used to see Dr. Gershon Crane [facility] for [facility]--Last seen March 2021    MD- Note 3am BMET shows CO2 still low at 12--Anion Gap 14  Please leave pt on the IV Insulin Drip until CO2 closer to 20 or higher and Anion Gap closer to 12 or less  When pt ready to transition, please consider weight based  basal/bolus regimen (0.2 units/kg): Semglee + Novolog Correction    Addendum 11:15am--Met w/ pt briefly in the ICU.  Pt told me she does NOT take the Januvia any more, is taking the Glipizide and Metformin, and is also taking Novolin 70/30 Insulin 20 units AM/ 10 units PM.  Pt told me she did not take any insulin for 4 days prior to admission b/c she was so sick.  Sees Dr. Burnadette Pop who renews all her diabetes Rxs.  Was seeing Dr. Gershon Crane with ENDO years ago but stopped seeing him b/c she did not care for his manner.  Reviewed pt's A1c of 11% with pt--Pt told me it always runs high and that she recently adjusted her 70/30 Insulin from 30 AM/ 5 PM to 20 units AM/ 10 units PM (PCP knows about this adjustment according to pt).  Also discussed with patient diagnosis of DKA (pathophysiology), treatment of DKA, lab results, and transition plan to SQ insulin regimen.  Pt has insulin and syringes at home.  Reviewed HYPO treatment with pt at home as well b/c pt did tell me she sometimes has HYPO events at home.  Called  Pharmacy and amended pt's home med rec to reflect  taking insulin at home.      --Will follow patient during hospitalization--  Ambrose Finland RN, MSN, CDCES Diabetes Coordinator Inpatient Glycemic Control Team Team Pager: [phone] (8a-5p)

## 2023-04-15 NOTE — Assessment & Plan Note (Addendum)
We will cont pt on CAP regimen although suspect aspiration may be a component in her as she has been having vomiting. Continue patient on Rocephin and azithromycin.

## 2023-04-15 NOTE — Assessment & Plan Note (Signed)
SpO2: 97 % Patient is on room air currently. Attribute shortness of breath secondary pneumonia or vomiting and possibly aspiration. Continue patient on Rocephin and azithromycin.

## 2023-04-15 NOTE — Assessment & Plan Note (Addendum)
Continue patient on 100 mcg of levothyroxine.

## 2023-04-16 DIAGNOSIS — E1169 Type 2 diabetes mellitus with other specified complication: Secondary | ICD-10-CM | POA: Diagnosis not present

## 2023-04-16 DIAGNOSIS — E876 Hypokalemia: Secondary | ICD-10-CM | POA: Diagnosis not present

## 2023-04-16 DIAGNOSIS — E1110 Type 2 diabetes mellitus with ketoacidosis without coma: Secondary | ICD-10-CM

## 2023-04-16 LAB — RENAL FUNCTION PANEL
Albumin: 2.8 g/dL — ABNORMAL LOW (ref 3.5–5.0)
Anion gap: 8 (ref 5–15)
BUN: 15 mg/dL (ref 8–23)
CO2: 22 mmol/L (ref 22–32)
Calcium: 8.5 mg/dL — ABNORMAL LOW (ref 8.9–10.3)
Chloride: 108 mmol/L (ref 98–111)
Creatinine, Ser: 0.72 mg/dL (ref 0.44–1.00)
GFR, Estimated: >60 mL/min (ref >60)
Glucose, Bld: 171 mg/dL — ABNORMAL HIGH (ref 70–99)
Phosphorus: 1.3 mg/dL — ABNORMAL LOW (ref 2.5–4.6)
Potassium: 3.3 mmol/L — ABNORMAL LOW (ref 3.5–5.1)
Sodium: 138 mmol/L (ref 135–145)

## 2023-04-16 LAB — GLUCOSE, CAPILLARY
Glucose-Capillary: 172 mg/dL — ABNORMAL HIGH (ref 70–99)
Glucose-Capillary: 239 mg/dL — ABNORMAL HIGH (ref 70–99)
Glucose-Capillary: 273 mg/dL — ABNORMAL HIGH (ref 70–99)
Glucose-Capillary: 221 mg/dL — ABNORMAL HIGH (ref 70–99)
Glucose-Capillary: 168 mg/dL — ABNORMAL HIGH (ref 70–99)

## 2023-04-16 LAB — CBC WITH DIFFERENTIAL/PLATELET
Abs Immature Granulocytes: 0.07 10*3/uL (ref 0.00–0.07)
Basophils Absolute: 0 10*3/uL (ref 0.0–0.1)
Basophils Relative: 0 %
Eosinophils Absolute: 0 10*3/uL (ref 0.0–0.5)
Eosinophils Relative: 0 %
HCT: 37 % (ref 36.0–46.0)
Hemoglobin: 12.1 g/dL (ref 12.0–15.0)
Immature Granulocytes: 1 %
Lymphocytes Relative: 17 %
Lymphs Abs: 1.3 10*3/uL (ref 0.7–4.0)
MCH: 29.9 pg (ref 26.0–34.0)
MCHC: 32.7 g/dL (ref 30.0–36.0)
MCV: 91.4 fL (ref 80.0–100.0)
Monocytes Absolute: 0.7 10*3/uL (ref 0.1–1.0)
Monocytes Relative: 9 %
Neutro Abs: 5.4 10*3/uL (ref 1.7–7.7)
Neutrophils Relative %: 73 %
Platelets: 259 10*3/uL (ref 150–400)
RBC: 4.05 MIL/uL (ref 3.87–5.11)
RDW: 13.4 % (ref 11.5–15.5)
WBC: 7.5 10*3/uL (ref 4.0–10.5)
nRBC: 0 % (ref 0.0–0.2)

## 2023-04-16 LAB — MAGNESIUM: Magnesium: 2.1 mg/dL (ref 1.7–2.4)

## 2023-04-16 MED ORDER — DOCUSATE SODIUM 100 MG PO CAPS
200.0000 mg | ORAL_CAPSULE | Freq: Two times a day (BID) | ORAL | Status: DC
  Administered 2023-04-16 – 2023-04-19 (×6): 200 mg via ORAL
  Filled 2023-04-16 (×6): qty 2

## 2023-04-16 MED ORDER — POTASSIUM CHLORIDE CRYS ER 20 MEQ PO TBCR
40.0000 meq | EXTENDED_RELEASE_TABLET | Freq: Once | ORAL | Status: AC
  Administered 2023-04-16: 40 meq via ORAL
  Filled 2023-04-16: qty 2

## 2023-04-16 MED ORDER — LEVOTHYROXINE SODIUM 100 MCG PO TABS
100.0000 ug | ORAL_TABLET | ORAL | Status: DC
  Administered 2023-04-19: 100 ug via ORAL
  Filled 2023-04-16: qty 1

## 2023-04-16 MED ORDER — ENOXAPARIN SODIUM 40 MG/0.4ML IJ SOSY
40.0000 mg | PREFILLED_SYRINGE | Freq: Every day | INTRAMUSCULAR | Status: DC
  Administered 2023-04-16 – 2023-04-18 (×3): 40 mg via SUBCUTANEOUS
  Filled 2023-04-16 (×3): qty 0.4

## 2023-04-16 MED ORDER — LEVOTHYROXINE SODIUM 112 MCG PO TABS
112.0000 ug | ORAL_TABLET | ORAL | Status: DC
  Administered 2023-04-17 – 2023-04-18 (×2): 112 ug via ORAL
  Filled 2023-04-16 (×2): qty 1

## 2023-04-16 MED ORDER — SODIUM PHOSPHATES 45 MMOLE/15ML IV SOLN
30.0000 mmol | Freq: Once | INTRAVENOUS | Status: AC
  Administered 2023-04-16: 30 mmol via INTRAVENOUS
  Filled 2023-04-16: qty 10

## 2023-04-16 NOTE — Progress Notes (Addendum)
PROGRESS NOTE    Jasmine Velasquez  WUJ:[id number] DOB: [birth year] DOA: 04/14/2023 PCP: Marisue Ivan, MD   Assessment & Plan:   Principal Problem:   DKA (diabetic ketoacidosis) (HCC) Active Problems:   SOB (shortness of breath)   Acquired hypothyroidism   Type 2 diabetes mellitus with hyperglycemia, with long-term current use of insulin (HCC)   CAP (community acquired pneumonia)   Glaucoma   Severe sepsis (HCC)   Hypophosphatemia  Assessment and Plan: Severe sepsis: secondary to pneumonia. Continue on IV rocephin, azithromycin, bronchodilators & encourage incentive spirometry. See Dr. Helayne Seminole note on how pt meets severe sepsis criteria.    DKA: anion gap has closed. Resolved    DMII: poorly controlled, HbA1c 11. Continue on glargine, SSI w/ accuchecks. Hold metformin, januvia & glimepiride.  Hypokalemia: potassium given    Metabolic acidosis: resolved    Hypophosphatemia: sodium phosphate ordered    Glaucoma: Continue latanoprost eyedrops        DVT prophylaxis: lovenox  Code Status: full  Family Communication: Disposition Plan: likely d/c to SNF   Level of care: Stepdown  Status is: Inpatient Remains inpatient appropriate because: severity of illness     Consultants:    Procedures:   Antimicrobials: azithromycin, rocephin    Subjective: Pt c/o malaise   Objective: Vitals:   04/15/23 1800 04/15/23 2100 04/16/23 0020 04/16/23 0400  BP:  (!) 150/57 (!) 147/69 (!) 127/52  Pulse:  88 88   Resp: (!) 32 (!) 41 (!) 30 (!) 31  Temp:  98.4 F (36.9 C) 98.3 F (36.8 C)   TempSrc:  Oral Oral   SpO2:  95% 94%   Weight:      Height:        Intake/Output Summary (Last 24 hours) at 04/16/2023 0922 Last data filed at 04/16/2023 0538 Gross per 24 hour  Intake 957 ml  Output --  Net 957 ml   Filed Weights   04/14/23 2003  Weight: 72.6 kg    Examination:  General exam: Appears calm and comfortable  Respiratory system: diminished breath sounds  b/l  Cardiovascular system: S1 & S2+. No rubs, gallops or clicks.  Gastrointestinal system: Abdomen is nondistended, soft and nontender. Normal bowel sounds heard. Central nervous system: Alert and awake. Moves all extremities  Psychiatry: Judgement and insight appears at baseline. Mood & affect appropriate.     Data Reviewed: I have personally reviewed following labs and imaging studies  CBC: Recent Labs  Lab 04/14/23 2025 04/15/23 0711 04/16/23 0610  WBC 13.9* 11.7* 7.5  NEUTROABS 11.7* 9.6* 5.4  HGB 14.7 13.9 12.1  HCT 47.7* 44.3 37.0  MCV 97.9 95.9 91.4  PLT 343 278 259   Basic Metabolic Panel: Recent Labs  Lab 04/14/23 2342 04/15/23 0306 04/15/23 0711 04/15/23 1200 04/16/23 0610  NA 133* 134* 135 134* 138  K 4.4 3.9 3.8 3.6 3.3*  CL 104 108 110 108 108  CO2 9* 12* 14* 15* 22  GLUCOSE 358* 151* 214* 212* 171*  BUN 29* 26* 24* 21 15  CREATININE 1.09* 0.96 0.90 0.89 0.72  CALCIUM 8.2* 8.3* 8.3* 8.2* 8.5*  MG  --   --  1.9  --  2.1  PHOS  --   --  1.8*  --  1.3*   GFR: Estimated Creatinine Clearance: 63.9 mL/min (by C-G formula based on SCr of 0.72 mg/dL). Liver Function Tests: Recent Labs  Lab 04/14/23 2025 04/15/23 0711 04/16/23 0610  AST 11* 13*  --   ALT  9 8  --   ALKPHOS 97 81  --   BILITOT 1.7* 1.2  --   PROT 8.2* 7.4  --   ALBUMIN 3.7 3.3* 2.8*   Recent Labs  Lab 04/14/23 2025  LIPASE 26   No results for input(s): "AMMONIA" in the last 168 hours. Coagulation Profile: No results for input(s): "INR", "PROTIME" in the last 168 hours. Cardiac Enzymes: No results for input(s): "CKTOTAL", "CKMB", "CKMBINDEX", "TROPONINI" in the last 168 hours. BNP (last 3 results) No results for input(s): "PROBNP" in the last 8760 hours. HbA1C: Recent Labs    04/14/23 2024  HGBA1C 11.0*   CBG: Recent Labs  Lab 04/15/23 1305 04/15/23 1612 04/15/23 1753 04/15/23 2211 04/16/23 0804  GLUCAP 179* 218* 206* 217* 172*   Lipid Profile: No results for  input(s): "CHOL", "HDL", "LDLCALC", "TRIG", "CHOLHDL", "LDLDIRECT" in the last 72 hours. Thyroid Function Tests: No results for input(s): "TSH", "T4TOTAL", "FREET4", "T3FREE", "THYROIDAB" in the last 72 hours. Anemia Panel: No results for input(s): "VITAMINB12", "FOLATE", "FERRITIN", "TIBC", "IRON", "RETICCTPCT" in the last 72 hours. Sepsis Labs: Recent Labs  Lab 04/14/23 2025 04/14/23 2256 04/15/23 0424 04/15/23 0711  LATICACIDVEN 1.7 3.0* 1.6 0.9    Recent Results (from the past 240 hour(s))  Blood culture (single)     Status: None (Preliminary result)   Collection Time: 04/14/23  8:25 PM   Specimen: BLOOD  Result Value Ref Range Status   Specimen Description BLOOD BLOOD RIGHT HAND  Final   Special Requests   Final    BOTTLES DRAWN AEROBIC AND ANAEROBIC Blood Culture results may not be optimal due to an inadequate volume of blood received in culture bottles   Culture   Final    NO GROWTH < 12 HOURS Performed at [facility], [address], [place], [place] [address]    Report Status PENDING  Incomplete  Resp panel by RT-PCR (RSV, Flu A&B, Covid) Anterior Nasal Swab     Status: None   Collection Time: 04/15/23  1:45 AM   Specimen: Anterior Nasal Swab  Result Value Ref Range Status   SARS Coronavirus 2 by RT PCR NEGATIVE NEGATIVE Final    Comment: (NOTE) SARS-CoV-2 target nucleic acids are NOT DETECTED.  The SARS-CoV-2 RNA is generally detectable in upper respiratory specimens during the acute phase of infection. The lowest concentration of SARS-CoV-2 viral copies this assay can detect is 138 copies/mL. A negative result does not preclude SARS-Cov-2 infection and should not be used as the sole basis for treatment or other patient management decisions. A negative result may occur with  improper specimen collection/handling, submission of specimen other than nasopharyngeal swab, presence of viral mutation(s) within the areas targeted by this assay, and inadequate  number of viral copies(<138 copies/mL). A negative result must be combined with clinical observations, patient history, and epidemiological information. The expected result is Negative.  Fact Sheet for Patients:  BloggerCourse.com  Fact Sheet for Healthcare Providers:  SeriousBroker.it  This test is no t yet approved or cleared by the [place] FDA and  has been authorized for detection and/or diagnosis of SARS-CoV-2 by FDA under an Emergency Use Authorization (EUA). This EUA will remain  in effect (meaning this test can be used) for the duration of the COVID-19 declaration under Section 564(b)(1) of the Act, 21 U.S.C.section 360bbb-3(b)(1), unless the authorization is terminated  or revoked sooner.       Influenza A by PCR NEGATIVE NEGATIVE Final   Influenza B by PCR NEGATIVE  NEGATIVE Final    Comment: (NOTE) The Xpert Xpress SARS-CoV-2/FLU/RSV plus assay is intended as an aid in the diagnosis of influenza from Nasopharyngeal swab specimens and should not be used as a sole basis for treatment. Nasal washings and aspirates are unacceptable for Xpert Xpress SARS-CoV-2/FLU/RSV testing.  Fact Sheet for Patients: BloggerCourse.com  Fact Sheet for Healthcare Providers: SeriousBroker.it  This test is not yet approved or cleared by the [place] FDA and has been authorized for detection and/or diagnosis of SARS-CoV-2 by FDA under an Emergency Use Authorization (EUA). This EUA will remain in effect (meaning this test can be used) for the duration of the COVID-19 declaration under Section 564(b)(1) of the Act, 21 U.S.C. section 360bbb-3(b)(1), unless the authorization is terminated or revoked.     Resp Syncytial Virus by PCR NEGATIVE NEGATIVE Final    Comment: (NOTE) Fact Sheet for Patients: BloggerCourse.com  Fact Sheet for Healthcare  Providers: SeriousBroker.it  This test is not yet approved or cleared by the [place] FDA and has been authorized for detection and/or diagnosis of SARS-CoV-2 by FDA under an Emergency Use Authorization (EUA). This EUA will remain in effect (meaning this test can be used) for the duration of the COVID-19 declaration under Section 564(b)(1) of the Act, 21 U.S.C. section 360bbb-3(b)(1), unless the authorization is terminated or revoked.  Performed at [facility], [address] [place], [place] [address]   MRSA Next Gen by PCR, Nasal     Status: None   Collection Time: 04/15/23  1:45 AM   Specimen: Nasal Mucosa; Nasal Swab  Result Value Ref Range Status   MRSA by PCR Next Gen NOT DETECTED NOT DETECTED Final    Comment: (NOTE) The GeneXpert MRSA Assay (FDA approved for NASAL specimens only), is one component of a comprehensive MRSA colonization surveillance program. It is not intended to diagnose MRSA infection nor to guide or monitor treatment for MRSA infections. Test performance is not FDA approved in patients less than 61 years old. Performed at [facility], [address] [place], [place] [address]          Radiology Studies: CT HEAD WO CONTRAST ( )  Result Date: 04/14/2023 CLINICAL DATA:  Mental status change, unknown cause EXAM: CT HEAD WITHOUT CONTRAST TECHNIQUE: Contiguous axial images were obtained from the base of the skull through the vertex without intravenous contrast. RADIATION DOSE REDUCTION: This exam was performed according to the departmental dose-optimization program which includes automated exposure control, adjustment of the mA and/or kV according to patient size and/or use of iterative reconstruction technique. COMPARISON:  None Available. FINDINGS: Brain: No evidence of large-territorial acute infarction. No parenchymal hemorrhage. No mass lesion. No extra-axial collection. No mass effect or midline  shift. No hydrocephalus. Basilar cisterns are patent. Vascular: No hyperdense vessel. Skull: No acute fracture or focal lesion. Sinuses/Orbits: Paranasal sinuses and mastoid air cells are clear. The orbits are unremarkable. Other: None. IMPRESSION: No acute intracranial abnormality. Electronically Signed   By: Tish Frederickson M.D.   On: 04/14/2023 22:16   CT ABDOMEN PELVIS W CONTRAST  Result Date: 04/14/2023 CLINICAL DATA:  Abdominal pain, acute, nonlocalized EXAM: CT ABDOMEN AND PELVIS WITH CONTRAST TECHNIQUE: Multidetector CT imaging of the abdomen and pelvis was performed using the standard protocol following bolus administration of intravenous contrast. RADIATION DOSE REDUCTION: This exam was performed according to the departmental dose-optimization program which includes automated exposure control, adjustment of the mA and/or kV according to patient size and/or use of iterative reconstruction technique. CONTRAST:  OMNIPAQUE IOHEXOL 300 MG/ML  SOLN COMPARISON:  Chest x-ray 04/14/2023 FINDINGS: Lower chest: Bibasilar vague tree-in-bud nodularity. Hepatobiliary: No focal liver abnormality. Status post cholecystectomy. No biliary dilatation. Pancreas: Diffusely atrophic. No focal lesion. Otherwise normal pancreatic contour. No surrounding inflammatory changes. No main pancreatic ductal dilatation. Spleen: Normal in size without focal abnormality. Adrenals/Urinary Tract: No adrenal nodule bilaterally. Bilateral kidneys enhance symmetrically. No hydronephrosis. No hydroureter. The urinary bladder is unremarkable. On delayed imaging, there is no urothelial wall thickening and there are no filling defects in the opacified portions of the bilateral collecting systems or ureters. Stomach/Bowel: Stomach is within normal limits. No evidence of bowel wall thickening or dilatation. The appendix is not definitely identified with no inflammatory changes in the right lower quadrant to suggest acute appendicitis.  Vascular/Lymphatic: No abdominal aorta or iliac aneurysm. Mild to moderate atherosclerotic plaque of the aorta and its branches. No abdominal, pelvic, or inguinal lymphadenopathy. Reproductive: Status post hysterectomy. No adnexal masses. Other: No intraperitoneal free fluid. No intraperitoneal free gas. No organized fluid collection. Musculoskeletal: Tiny fat containing umbilical hernia. No suspicious lytic or blastic osseous lesions. No acute displaced fracture. IMPRESSION: 1. Bibasilar vague tree-in-bud nodularity. Findings suggestive of atypical infection. No follow-up needed if patient is low-risk (and has no known or suspected primary neoplasm). Non-contrast chest CT can be considered in 12 months if patient is high-risk. This recommendation follows the consensus statement: Guidelines for Management of Incidental Pulmonary Nodules Detected on CT Images: From the Fleischner Society 2017; Radiology 2017; 284:228-243. 2. No acute intra-abdominal or intrapelvic abnormality. 3. Aortic Atherosclerosis (ICD10-I70.0). Electronically Signed   By: Tish Frederickson M.D.   On: 04/14/2023 22:02   DG Chest Portable 1 View  Result Date: 04/14/2023 CLINICAL DATA:  Shortness of breath EXAM: PORTABLE CHEST 1 VIEW COMPARISON:  11/28/2011 FINDINGS: Cardiac shadow is within normal limits. Aortic calcifications are seen. Lungs are well aerated bilaterally. Mild increased opacity is noted in the right lung base consistent with developing infiltrate. No bony abnormality is noted. IMPRESSION: Increasing airspace opacity in the right base. Electronically Signed   By: Alcide Clever M.D.   On: 04/14/2023 21:49        Scheduled Meds:  azithromycin  500 mg Oral Daily   Chlorhexidine Gluconate Cloth  6 each Topical Daily   heparin  5,000 Units Subcutaneous Q8H   insulin aspart  0-5 Units Subcutaneous QHS   insulin aspart  0-9 Units Subcutaneous TID WC   insulin glargine-yfgn  20 Units Subcutaneous Daily   latanoprost  1 drop  Right Eye QHS   pantoprazole  40 mg Oral BID   senna  1 tablet Oral Daily   Continuous Infusions:  sodium chloride     cefTRIAXone (ROCEPHIN)  IV 2 g (04/16/23 0538)   dextrose 5% lactated ringers Stopped (04/15/23 1221)     LOS: 2 days    Time spent: 35 mins     Charise Killian, MD [facility] Pager [phone]  If 7PM-7AM, please contact night-coverage [email or web address] 04/16/2023, 9:22 AM

## 2023-04-16 NOTE — Progress Notes (Cosign Needed Addendum)
Physical Therapy Treatment Patient Details Name: Jasmine Velasquez MRN: [id number] DOB: [birth year] Today's Date: 04/16/2023   History of Present Illness Pt is a 72 y.o. female here with hyperglycemia and SOB. Pt reports that over the past week, she has had progressively worsening nausea, and inability to eat/drink. Over the past 24 hours she has had worsening SOB and feels weak and fatigued. PMHx arthritis, asthma, poorly controlled IDDM, bipolar 1 disorder, breast cancer, glaucoma in R eye, and hypothyroidism.    PT Comments    Pt was long sitting in bed watching a movie upon arrival. She is A and O x 3. Agrees to PT session and remains cooperative and pleasant throughout. She demonstrated safe abilities to exit bed, stand, and ambulate without AD. Some unsteadiness was noted without AD. Author encouraged pt to use AD at DC. She was also able to safely perform ascending/descending 4 stair 2 times to simulate home entry. Sao2 did drop to 86% on rm air during gait but recovers quickly once seated rest taken. RN made aware. Currently, refusing post acute PT however author highly recommends [facility] services to maximize her independence and safety with all ADLs. Author called pt's niece (listed contact in chart) post session and confirmed that pt will have assistance/supervision at DC. DC recs updated. Acute PT will continue to follow per current POC.     Recommendations for follow up therapy are one component of a multi-disciplinary discharge planning process, led by the attending physician.  Recommendations may be updated based on patient status, additional functional criteria and insurance authorization.     Assistance Recommended at Discharge Intermittent Supervision/Assistance  Patient can return home with the following A little help with walking and/or transfers;A little help with bathing/dressing/bathroom;Help with stairs or ramp for entrance;Assist for transportation;Direct supervision/assist for  medications management   Equipment Recommendations  None recommended by PT       Precautions / Restrictions Precautions Precautions: Fall Restrictions Weight Bearing Restrictions: No     Mobility  Bed Mobility Overal bed mobility: Needs Assistance Bed Mobility: Supine to Sit, Sit to Supine  Supine to sit: Supervision Sit to supine: Supervision General bed mobility comments: no physical assistance required to exit bed or return to bed after OOB activity    Transfers Overall transfer level: Needs assistance Equipment used: None Transfers: Sit to/from Stand Sit to Stand: Supervision  General transfer comment: Pt was able to stand without AD or UE support however author does recoimmend use of AD at DC.    Ambulation/Gait Ambulation/Gait assistance: Supervision Gait Distance (Feet): 120 Feet Assistive device: None Gait Pattern/deviations: Step-through pattern, Drifts right/left Gait velocity: WNL     General Gait Details: Pt was able to ambulate ~ 120 ft without AD. does have some driftiung in hallway but no intervention required. Chartered loss adjuster discussed use of AD at DC for additional gait safety. Pt states she is agreeable to use at DC. She has RW and SPC at home.   Stairs Stairs: Yes Stairs assistance: Supervision Stair Management: Two rails, One rail Right, Forwards, Alternating pattern Number of Stairs: 4 General stair comments: Pt performed ascending/descending 4 stairs 2 x. 1 x with BUE support and 1 x with RUE support only. close supervision for safety     Balance Overall balance assessment: Needs assistance Sitting-balance support: Feet supported Sitting balance-Leahy Scale: Good Sitting balance - Comments: no LOB in sitting   Standing balance support: No upper extremity supported, During functional activity, Reliant on assistive device for balance Standing balance-Leahy  Scale: Fair Standing balance comment: Chartered loss adjuster highly recommends use of AD at DC to additional  safety during all ADLs       Cognition Arousal/Alertness: Awake/alert Behavior During Therapy: WFL for tasks assessed/performed Overall Cognitive Status: Within Functional Limits for tasks assessed      General Comments: Pt is A and O x 3. Agrees to session and remians cooperative throughout.               Pertinent Vitals/Pain Pain Assessment Pain Assessment: No/denies pain     PT Goals (current goals can now be found in the care plan section) Acute Rehab PT Goals Patient Stated Goal: get stronger and go home Progress towards PT goals: Progressing toward goals    Frequency    Min 3X/week      PT Plan Discharge plan needs to be updated       AM-PAC PT "6 Clicks" Mobility   Outcome Measure  Help needed turning from your back to your side while in a flat bed without using bedrails?: A Little Help needed moving from lying on your back to sitting on the side of a flat bed without using bedrails?: A Little Help needed moving to and from a bed to a chair (including a wheelchair)?: A Little Help needed standing up from a chair using your arms (e.g., wheelchair or bedside chair)?: A Little Help needed to walk in hospital room?: A Little Help needed climbing 3-5 steps with a railing? : A Little 6 Click Score: 18    End of Session   Activity Tolerance: Patient tolerated treatment well;Patient limited by fatigue (Pt desaturates to 86% on rm air during ambulation. RN made aware. Pt endorses being a former smoker but has recently quit.) Patient left: in bed;with call bell/phone within reach;with bed alarm set Nurse Communication: Mobility status PT Visit Diagnosis: Other abnormalities of gait and mobility (R26.89);Muscle weakness (generalized) (M62.81);Difficulty in walking, not elsewhere classified (R26.2)     Time: 1610-9604 PT Time Calculation (min) (ACUTE ONLY): 13 min  Charges:  $Gait Training: 8-22 mins                    Jetta Lout PTA 04/16/23, 4:28 PM

## 2023-04-16 NOTE — Inpatient Diabetes Management (Signed)
Inpatient Diabetes Program Recommendations  AACE/ADA: New Consensus Statement on Inpatient Glycemic Control (2015)  Target Ranges:  Prepandial:   less than 140 mg/dL      Peak postprandial:   less than 180 mg/dL (1-2 hours)      Critically ill patients:  140 - 180 mg/dL    Latest Reference Range & Units 04/16/23 08:04 04/16/23 11:45  Glucose-Capillary 70 - 99 mg/dL 161 (H)  1 unit Novolog  20 units Semglee  239 (H)  (H): Data is abnormally high    Home DM Meds: Amaryl 4 mg TID                             Januvia 50 mg Daily                             Metformin 1000 mg Daily   Current Orders: Semglee 20 units daily       Novolog Sensitive Correction Scale/ SSI (0-9 units) TID AC + HS     PCP: Dr. Burnadette Pop Last seen 12/19/2022 A1c at that visit was 11.8%--Per MD notes, was taking 70/30 Insulin--30 units AM/ 5 units PM   Used to see Dr. Gershon Crane [facility] for [facility]--Last seen March 2021   MD- Please consider starting Novolog Meal Coverage:  Novolog 3 units TID with meals HOLD if pt NPO HOLD it pt eats <50% meals     --Will follow patient during hospitalization--  Ambrose Finland RN, MSN, CDCES Diabetes Coordinator Inpatient Glycemic Control Team Team Pager: [phone] (8a-5p)

## 2023-04-17 ENCOUNTER — Inpatient Hospital Stay: Payer: Medicare HMO

## 2023-04-17 DIAGNOSIS — J189 Pneumonia, unspecified organism: Secondary | ICD-10-CM | POA: Diagnosis not present

## 2023-04-17 DIAGNOSIS — E1169 Type 2 diabetes mellitus with other specified complication: Secondary | ICD-10-CM | POA: Diagnosis not present

## 2023-04-17 LAB — GLUCOSE, CAPILLARY
Glucose-Capillary: 186 mg/dL — ABNORMAL HIGH (ref 70–99)
Glucose-Capillary: 194 mg/dL — ABNORMAL HIGH (ref 70–99)
Glucose-Capillary: 214 mg/dL — ABNORMAL HIGH (ref 70–99)
Glucose-Capillary: 106 mg/dL — ABNORMAL HIGH (ref 70–99)

## 2023-04-17 LAB — BASIC METABOLIC PANEL
Anion gap: 6 (ref 5–15)
BUN: 13 mg/dL (ref 8–23)
CO2: 26 mmol/L (ref 22–32)
Calcium: 8.2 mg/dL — ABNORMAL LOW (ref 8.9–10.3)
Chloride: 106 mmol/L (ref 98–111)
Creatinine, Ser: 0.63 mg/dL (ref 0.44–1.00)
GFR, Estimated: >60 mL/min (ref >60)
Glucose, Bld: 215 mg/dL — ABNORMAL HIGH (ref 70–99)
Potassium: 3.6 mmol/L (ref 3.5–5.1)
Sodium: 138 mmol/L (ref 135–145)

## 2023-04-17 LAB — PHOSPHORUS: Phosphorus: 2 mg/dL — ABNORMAL LOW (ref 2.5–4.6)

## 2023-04-17 LAB — MAGNESIUM: Magnesium: 2.4 mg/dL (ref 1.7–2.4)

## 2023-04-17 MED ORDER — FUROSEMIDE 10 MG/ML IJ SOLN
40.0000 mg | Freq: Once | INTRAMUSCULAR | Status: AC
  Administered 2023-04-17: 40 mg via INTRAVENOUS
  Filled 2023-04-17: qty 4

## 2023-04-17 MED ORDER — POTASSIUM PHOSPHATES 15 MMOLE/5ML IV SOLN
30.0000 mmol | Freq: Once | INTRAVENOUS | Status: AC
  Administered 2023-04-17: 30 mmol via INTRAVENOUS
  Filled 2023-04-17: qty 10

## 2023-04-17 MED ORDER — METOPROLOL TARTRATE 5 MG/5ML IV SOLN: 5.0000 mg | Freq: Four times a day (QID) | INTRAVENOUS | Status: DC | PRN

## 2023-04-17 NOTE — Progress Notes (Addendum)
Telemetry called and stated that patient had a 9 beat run of SVT. Made MD aware, new order for Lopressor PRN.

## 2023-04-17 NOTE — Care Management Important Message (Signed)
Important Message  Patient Details  Name: Jasmine Velasquez MRN: [id number] Date of Birth: [birth year]   Medicare Important Message Given:  Yes     Olegario Messier A Kerly Rigsbee 04/17/2023, 1:29 PM

## 2023-04-17 NOTE — Progress Notes (Signed)
Mobility Specialist - Progress Note   04/17/23 1330  Mobility  Activity Ambulated independently in room;Ambulated independently to bathroom  Level of Assistance Independent  Assistive Device None  Distance Ambulated (ft) 20 ft  Activity Response Tolerated well  Mobility Referral Yes  $Mobility charge 1 Mobility  Mobility Specialist Start Time (ACUTE ONLY) 0118  Mobility Specialist Stop Time (ACUTE ONLY) 0129  Mobility Specialist Time Calculation (min) (ACUTE ONLY) 11 min   Pt OOB upon arrival. Pt ambulates to/from bathroom indep. Pt returns to bed with needs in reach.   Terrilyn Saver  Mobility Specialist  04/17/23 1:32 PM

## 2023-04-17 NOTE — Progress Notes (Signed)
SATURATION QUALIFICATIONS: (This note is used to comply with regulatory documentation for home oxygen)  Patient Saturations on Room Air at Rest = 99%  Patient Saturations on Room Air while Ambulating = 90%  Patient Saturations on 2 Liters of oxygen while Ambulating =96 %

## 2023-04-17 NOTE — Progress Notes (Addendum)
PROGRESS NOTE    Jasmine MCGINNITY  GNF:[id number] DOB: March 12, 1951 DOA: 04/14/2023 PCP: Marisue Ivan, MD   Assessment & Plan:   Principal Problem:   DKA (diabetic ketoacidosis) (HCC) Active Problems:   SOB (shortness of breath)   Acquired hypothyroidism   Type 2 diabetes mellitus with hyperglycemia, with long-term current use of insulin (HCC)   CAP (community acquired pneumonia)   Glaucoma   Severe sepsis (HCC)   Hypophosphatemia  Assessment and Plan: Severe sepsis: secondary to pneumonia. Pt c/o shortness of breath today. Continue on IV rocephin, azithromycin, bronchodilators & encourage incentive spirometry. See Dr. Helayne Seminole note on how pt meets severe sepsis criteria.    DKA: anion gap has closed. Resolved    DMII: poorly controlled, HbA1c 11. Continue on glargine, SSI w/ accuchecks. Holding glimepiride, januvia & metformin   Hypokalemia: WNL today   Metabolic acidosis: resolved    Hypophosphatemia: potassium phosphate ordered    Glaucoma: continue on latanoprost eyedrops          DVT prophylaxis: lovenox  Code Status: full  Family Communication: Disposition Plan: likely d/c back home. Therapy recs home health but pt isnt sure she wants home health   Level of care: Med-Surg  Status is: Inpatient Remains inpatient appropriate because: likely d/c home tomorro    Consultants:    Procedures:   Antimicrobials: azithromycin, rocephin    Subjective: Pt c/o fatigue & shortness of breath   Objective: Vitals:   04/16/23 1507 04/16/23 2005 04/16/23 2340 04/17/23 0505  BP: (!) 150/57 123/76 (!) 150/95 (!) 128/57  Pulse: 81 71 87 84  Resp: 20 18 18 18   Temp: 97.9 F (36.6 C) 98.4 F (36.9 C) 98.5 F (36.9 C) 98 F (36.7 C)  TempSrc: Oral     SpO2: 97% 94% 98% 97%  Weight:      Height:        Intake/Output Summary (Last 24 hours) at 04/17/2023 0746 Last data filed at 04/16/2023 1931 Gross per 24 hour  Intake 1358.8 ml  Output --  Net 1358.8  ml   Filed Weights   04/14/23 2003  Weight: 72.6 kg    Examination:  General exam: Appears comfortable  Respiratory system: decreased breath sounds b/l  Cardiovascular system: S1/S2+. No rubs or clicks  Gastrointestinal system: Abd is soft, NT, ND & normal bowel sounds  Central nervous system: alert and oriented. Moves all extremities  Psychiatry: Judgement and insight appears at baseline. Flat mood and affect    Data Reviewed: I have personally reviewed following labs and imaging studies  CBC: Recent Labs  Lab 04/14/23 2025 04/15/23 0711 04/16/23 0610  WBC 13.9* 11.7* 7.5  NEUTROABS 11.7* 9.6* 5.4  HGB 14.7 13.9 12.1  HCT 47.7* 44.3 37.0  MCV 97.9 95.9 91.4  PLT 343 278 259   Basic Metabolic Panel: Recent Labs  Lab 04/15/23 0306 04/15/23 0711 04/15/23 1200 04/16/23 0610 04/17/23 0540  NA 134* 135 134* 138 138  K 3.9 3.8 3.6 3.3* 3.6  CL 108 110 108 108 106  CO2 12* 14* 15* 22 26  GLUCOSE 151* 214* 212* 171* 215*  BUN 26* 24* 21 15 13   CREATININE 0.96 0.90 0.89 0.72 0.63  CALCIUM 8.3* 8.3* 8.2* 8.5* 8.2*  MG  --  1.9  --  2.1 2.4  PHOS  --  1.8*  --  1.3* 2.0*   GFR: Estimated Creatinine Clearance: 63.9 mL/min (by C-G formula based on SCr of 0.63 mg/dL). Liver Function Tests: Recent Labs  Lab 04/14/23 2025 04/15/23 0711 04/16/23 0610  AST 11* 13*  --   ALT 9 8  --   ALKPHOS 97 81  --   BILITOT 1.7* 1.2  --   PROT 8.2* 7.4  --   ALBUMIN 3.7 3.3* 2.8*   Recent Labs  Lab 04/14/23 2025  LIPASE 26   No results for input(s): "AMMONIA" in the last 168 hours. Coagulation Profile: No results for input(s): "INR", "PROTIME" in the last 168 hours. Cardiac Enzymes: No results for input(s): "CKTOTAL", "CKMB", "CKMBINDEX", "TROPONINI" in the last 168 hours. BNP (last 3 results) No results for input(s): "PROBNP" in the last 8760 hours. HbA1C: Recent Labs    04/14/23 2024  HGBA1C 11.0*   CBG: Recent Labs  Lab 04/16/23 0804 04/16/23 1145  04/16/23 1508 04/16/23 1710 04/16/23 2006  GLUCAP 172* 239* 273* 221* 168*   Lipid Profile: No results for input(s): "CHOL", "HDL", "LDLCALC", "TRIG", "CHOLHDL", "LDLDIRECT" in the last 72 hours. Thyroid Function Tests: No results for input(s): "TSH", "T4TOTAL", "FREET4", "T3FREE", "THYROIDAB" in the last 72 hours. Anemia Panel: No results for input(s): "VITAMINB12", "FOLATE", "FERRITIN", "TIBC", "IRON", "RETICCTPCT" in the last 72 hours. Sepsis Labs: Recent Labs  Lab 04/14/23 2025 04/14/23 2256 04/15/23 0424 04/15/23 0711  LATICACIDVEN 1.7 3.0* 1.6 0.9    Recent Results (from the past 240 hour(s))  Blood culture (single)     Status: None (Preliminary result)   Collection Time: 04/14/23  8:25 PM   Specimen: BLOOD  Result Value Ref Range Status   Specimen Description BLOOD BLOOD RIGHT HAND  Final   Special Requests   Final    BOTTLES DRAWN AEROBIC AND ANAEROBIC Blood Culture results may not be optimal due to an inadequate volume of blood received in culture bottles   Culture   Final    NO GROWTH 3 DAYS Performed at [facility], [address], [place], [place] [address]    Report Status PENDING  Incomplete  Resp panel by RT-PCR (RSV, Flu A&B, Covid) Anterior Nasal Swab     Status: None   Collection Time: 04/15/23  1:45 AM   Specimen: Anterior Nasal Swab  Result Value Ref Range Status   SARS Coronavirus 2 by RT PCR NEGATIVE NEGATIVE Final    Comment: (NOTE) SARS-CoV-2 target nucleic acids are NOT DETECTED.  The SARS-CoV-2 RNA is generally detectable in upper respiratory specimens during the acute phase of infection. The lowest concentration of SARS-CoV-2 viral copies this assay can detect is 138 copies/mL. A negative result does not preclude SARS-Cov-2 infection and should not be used as the sole basis for treatment or other patient management decisions. A negative result may occur with  improper specimen collection/handling, submission of specimen  other than nasopharyngeal swab, presence of viral mutation(s) within the areas targeted by this assay, and inadequate number of viral copies(<138 copies/mL). A negative result must be combined with clinical observations, patient history, and epidemiological information. The expected result is Negative.  Fact Sheet for Patients:  BloggerCourse.com  Fact Sheet for Healthcare Providers:  SeriousBroker.it  This test is no t yet approved or cleared by the [place] FDA and  has been authorized for detection and/or diagnosis of SARS-CoV-2 by FDA under an Emergency Use Authorization (EUA). This EUA will remain  in effect (meaning this test can be used) for the duration of the COVID-19 declaration under Section 564(b)(1) of the Act, 21 U.S.C.section 360bbb-3(b)(1), unless the authorization is terminated  or revoked sooner.  Influenza A by PCR NEGATIVE NEGATIVE Final   Influenza B by PCR NEGATIVE NEGATIVE Final    Comment: (NOTE) The Xpert Xpress SARS-CoV-2/FLU/RSV plus assay is intended as an aid in the diagnosis of influenza from Nasopharyngeal swab specimens and should not be used as a sole basis for treatment. Nasal washings and aspirates are unacceptable for Xpert Xpress SARS-CoV-2/FLU/RSV testing.  Fact Sheet for Patients: BloggerCourse.com  Fact Sheet for Healthcare Providers: SeriousBroker.it  This test is not yet approved or cleared by the [place] FDA and has been authorized for detection and/or diagnosis of SARS-CoV-2 by FDA under an Emergency Use Authorization (EUA). This EUA will remain in effect (meaning this test can be used) for the duration of the COVID-19 declaration under Section 564(b)(1) of the Act, 21 U.S.C. section 360bbb-3(b)(1), unless the authorization is terminated or revoked.     Resp Syncytial Virus by PCR NEGATIVE NEGATIVE Final     Comment: (NOTE) Fact Sheet for Patients: BloggerCourse.com  Fact Sheet for Healthcare Providers: SeriousBroker.it  This test is not yet approved or cleared by the [place] FDA and has been authorized for detection and/or diagnosis of SARS-CoV-2 by FDA under an Emergency Use Authorization (EUA). This EUA will remain in effect (meaning this test can be used) for the duration of the COVID-19 declaration under Section 564(b)(1) of the Act, 21 U.S.C. section 360bbb-3(b)(1), unless the authorization is terminated or revoked.  Performed at [facility], [address] [place], [place] [address]   MRSA Next Gen by PCR, Nasal     Status: None   Collection Time: 04/15/23  1:45 AM   Specimen: Nasal Mucosa; Nasal Swab  Result Value Ref Range Status   MRSA by PCR Next Gen NOT DETECTED NOT DETECTED Final    Comment: (NOTE) The GeneXpert MRSA Assay (FDA approved for NASAL specimens only), is one component of a comprehensive MRSA colonization surveillance program. It is not intended to diagnose MRSA infection nor to guide or monitor treatment for MRSA infections. Test performance is not FDA approved in patients less than 43 years old. Performed at [facility], [address], [place], [place] [address]          Radiology Studies: No results found.      Scheduled Meds:  azithromycin  500 mg Oral Daily   docusate sodium  200 mg Oral BID   enoxaparin (LOVENOX) injection  40 mg Subcutaneous QHS   insulin aspart  0-5 Units Subcutaneous QHS   insulin aspart  0-9 Units Subcutaneous TID WC   insulin glargine-yfgn  20 Units Subcutaneous Daily   latanoprost  1 drop Right Eye QHS   levothyroxine  112 mcg Oral Once per day on Mon Tue Wed Thu Fri   And   [START ON 04/19/2023] levothyroxine  100 mcg Oral Once per day on Sun Sat   pantoprazole  40 mg Oral BID   senna  1 tablet Oral Daily   Continuous Infusions:   cefTRIAXone (ROCEPHIN)  IV 2 g (04/17/23 0552)     LOS: 3 days    Time spent: 30 mins     Charise Killian, MD [facility] Pager [phone]  If 7PM-7AM, please contact night-coverage [email or web address] 04/17/2023, 7:46 AM

## 2023-04-17 NOTE — Progress Notes (Signed)
Mobility Specialist - Progress Note   04/17/23 1006  Mobility  Activity Ambulated with assistance in hallway  Level of Assistance Standby assist, set-up cues, supervision of patient - no hands on  Assistive Device None  Distance Ambulated (ft) 80 ft  Activity Response Tolerated well  Mobility Referral Yes  $Mobility charge 1 Mobility  Mobility Specialist Start Time (ACUTE ONLY) 0932  Mobility Specialist Stop Time (ACUTE ONLY) 0945  Mobility Specialist Time Calculation (min) (ACUTE ONLY) 13 min   Pt sitting in recliner on 2L upon arrival. Pt STS and ambulates in hallway Supervision. Pt does endorse some SOB, RN notified. Pt returns to recliner with needs in reach.   2L Rest SPO2 = 99 RA at Rest SPO2 = 96  RA during ambulation SPO2 = 90  [facility]  Mobility Specialist  04/17/23 10:25 AM

## 2023-04-17 NOTE — Progress Notes (Signed)
Occupational Therapy Treatment Patient Details Name: Jasmine Velasquez MRN: [id number] DOB: 12/18/1950 Today's Date: 04/17/2023   History of present illness Pt is a 72 y.o. female here with hyperglycemia and SOB. Pt reports that over the past week, she has had progressively worsening nausea, and inability to eat/drink. Over the past 24 hours she has had worsening SOB and feels weak and fatigued. PMHx arthritis, asthma, poorly controlled IDDM, bipolar 1 disorder, breast cancer, glaucoma in R eye, and hypothyroidism.   OT comments  Ms. Bastow reports that she continues to feel much weaker and more tired than is her usual; however, she is able to perform fxl mobility tasks without physical assistance, engaging in OOB mobility both w/ and w/o a RW (no AD is her baseline). Pt does have a RW at home; therapist recommend she use this while she is rebuilding her strength and endurance. Pt is able to perform multiple sit<>stand as well as UE and LE therex, w/o LOB, although fatiguing quickly. Pt reports she has family and a neighbor who can provide PRN assistance at home. Have changed DC recs to HHOT.   Recommendations for follow up therapy are one component of a multi-disciplinary discharge planning process, led by the attending physician.  Recommendations may be updated based on patient status, additional functional criteria and insurance authorization.    Assistance Recommended at Discharge Intermittent Supervision/Assistance  Patient can return home with the following  A little help with walking and/or transfers;A little help with bathing/dressing/bathroom;Assistance with cooking/housework;Assist for transportation;Help with stairs or ramp for entrance   Equipment Recommendations  None recommended by OT    Recommendations for Other Services      Precautions / Restrictions Precautions Precautions: Fall Restrictions Weight Bearing Restrictions: No       Mobility Bed Mobility Overal bed  mobility: Needs Assistance Bed Mobility: Supine to Sit     Supine to sit: Supervision     General bed mobility comments: able to perform w/o physical assistance    Transfers Overall transfer level: Needs assistance Equipment used: None, Rolling walker (2 wheels) Transfers: Sit to/from Stand Sit to Stand: Supervision     Step pivot transfers: Supervision     General transfer comment: Sit<>stand x 6 from surfaces of various heights, w/ and w/o RW. No physical assistance required     Balance Overall balance assessment: Needs assistance Sitting-balance support: Feet supported Sitting balance-Leahy Scale: Good     Standing balance support: No upper extremity supported, During functional activity, Bilateral upper extremity supported Standing balance-Leahy Scale: Fair Standing balance comment: Stands/ambulates w/ and w/o RW, no LOB                           ADL either performed or assessed with clinical judgement   ADL Overall ADL's : Needs assistance/impaired Eating/Feeding: Independent   Grooming: Wash/dry face;Wash/dry hands;Standing;Supervision/safety                               Functional mobility during ADLs: Supervision/safety;Rolling walker (2 wheels)      Extremity/Trunk Assessment Upper Extremity Assessment Upper Extremity Assessment: Generalized weakness   Lower Extremity Assessment Lower Extremity Assessment: Generalized weakness   Cervical / Trunk Assessment Cervical / Trunk Assessment: Normal    Vision Patient Visual Report: No change from baseline     Perception     Praxis      Cognition Arousal/Alertness: Awake/alert Behavior During  Therapy: WFL for tasks assessed/performed Overall Cognitive Status: Within Functional Limits for tasks assessed                                 General Comments: Pt is A and O x 3. Agrees to session and remians cooperative throughout.        Exercises Other  Exercises Other Exercises: Educ re: ECS, use of AD, UE and LE therex in standing    Shoulder Instructions       General Comments      Pertinent Vitals/ Pain       Pain Assessment Pain Assessment: No/denies pain  Home Living                                          Prior Functioning/Environment              Frequency  Min 2X/week        Progress Toward Goals  OT Goals(current goals can now be found in the care plan section)  Progress towards OT goals: Progressing toward goals  Acute Rehab OT Goals OT Goal Formulation: With patient Time For Goal Achievement: 04/29/23 Potential to Achieve Goals: Good  Plan Discharge plan needs to be updated;Frequency remains appropriate    Co-evaluation                 AM-PAC OT "6 Clicks" Daily Activity     Outcome Measure   Help from another person eating meals?: None Help from another person taking care of personal grooming?: A Little Help from another person toileting, which includes using toliet, bedpan, or urinal?: A Little Help from another person bathing (including washing, rinsing, drying)?: A Little Help from another person to put on and taking off regular upper body clothing?: None Help from another person to put on and taking off regular lower body clothing?: A Little 6 Click Score: 20    End of Session Equipment Utilized During Treatment: Rolling walker (2 wheels)  OT Visit Diagnosis: Other abnormalities of gait and mobility (R26.89);Muscle weakness (generalized) (M62.81)   Activity Tolerance Patient tolerated treatment well   Patient Left in chair;with call bell/phone within reach   Nurse Communication          Time: 1610-9604 OT Time Calculation (min): 18 min  Charges: OT General Charges $OT Visit: 1 Visit OT Treatments $Self Care/Home Management : 8-22 mins Latina Craver, PhD, MS, OTR/L 04/17/23, 11:27 AM

## 2023-04-18 DIAGNOSIS — J189 Pneumonia, unspecified organism: Secondary | ICD-10-CM | POA: Diagnosis not present

## 2023-04-18 DIAGNOSIS — E1169 Type 2 diabetes mellitus with other specified complication: Secondary | ICD-10-CM | POA: Diagnosis not present

## 2023-04-18 LAB — GLUCOSE, CAPILLARY
Glucose-Capillary: 138 mg/dL — ABNORMAL HIGH (ref 70–99)
Glucose-Capillary: 247 mg/dL — ABNORMAL HIGH (ref 70–99)
Glucose-Capillary: 386 mg/dL — ABNORMAL HIGH (ref 70–99)
Glucose-Capillary: 338 mg/dL — ABNORMAL HIGH (ref 70–99)

## 2023-04-18 LAB — BASIC METABOLIC PANEL
Anion gap: 6 (ref 5–15)
BUN: 9 mg/dL (ref 8–23)
CO2: 35 mmol/L — ABNORMAL HIGH (ref 22–32)
Calcium: 8.1 mg/dL — ABNORMAL LOW (ref 8.9–10.3)
Chloride: 100 mmol/L (ref 98–111)
Creatinine, Ser: 0.54 mg/dL (ref 0.44–1.00)
GFR, Estimated: >60 mL/min (ref >60)
Glucose, Bld: 138 mg/dL — ABNORMAL HIGH (ref 70–99)
Potassium: 3 mmol/L — ABNORMAL LOW (ref 3.5–5.1)
Sodium: 141 mmol/L (ref 135–145)

## 2023-04-18 LAB — PHOSPHORUS: Phosphorus: 1.8 mg/dL — ABNORMAL LOW (ref 2.5–4.6)

## 2023-04-18 LAB — MAGNESIUM: Magnesium: 2.2 mg/dL (ref 1.7–2.4)

## 2023-04-18 MED ORDER — ALBUTEROL SULFATE (2.5 MG/3ML) 0.083% IN NEBU
2.5000 mg | INHALATION_SOLUTION | Freq: Four times a day (QID) | RESPIRATORY_TRACT | Status: DC
  Administered 2023-04-19: 2.5 mg via RESPIRATORY_TRACT
  Filled 2023-04-18: qty 3

## 2023-04-18 MED ORDER — HYDROCOD POLI-CHLORPHE POLI ER 10-8 MG/5ML PO SUER: 5.0000 mL | Freq: Two times a day (BID) | ORAL | Status: DC | PRN

## 2023-04-18 MED ORDER — POTASSIUM PHOSPHATES 15 MMOLE/5ML IV SOLN
30.0000 mmol | Freq: Once | INTRAVENOUS | Status: AC
  Administered 2023-04-18: 30 mmol via INTRAVENOUS
  Filled 2023-04-18: qty 10

## 2023-04-18 MED ORDER — ALBUTEROL SULFATE (2.5 MG/3ML) 0.083% IN NEBU
2.5000 mg | INHALATION_SOLUTION | RESPIRATORY_TRACT | Status: DC
  Administered 2023-04-18 (×2): 2.5 mg via RESPIRATORY_TRACT
  Filled 2023-04-18 (×2): qty 3

## 2023-04-18 MED ORDER — POTASSIUM CHLORIDE CRYS ER 20 MEQ PO TBCR
40.0000 meq | EXTENDED_RELEASE_TABLET | Freq: Two times a day (BID) | ORAL | Status: AC
  Administered 2023-04-18 (×2): 40 meq via ORAL
  Filled 2023-04-18 (×2): qty 2

## 2023-04-18 MED ORDER — ALBUTEROL SULFATE (2.5 MG/3ML) 0.083% IN NEBU: 2.5000 mg | INHALATION_SOLUTION | RESPIRATORY_TRACT | Status: DC | PRN

## 2023-04-18 NOTE — Progress Notes (Signed)
PROGRESS NOTE    Jasmine Velasquez  WGN:[id number] DOB: 04/26/1951 DOA: 04/14/2023 PCP: Marisue Ivan, MD   Assessment & Plan:   Principal Problem:   DKA (diabetic ketoacidosis) (HCC) Active Problems:   SOB (shortness of breath)   Acquired hypothyroidism   Type 2 diabetes mellitus with hyperglycemia, with long-term current use of insulin (HCC)   CAP (community acquired pneumonia)   Glaucoma   Severe sepsis (HCC)   Hypophosphatemia  Assessment and Plan: Severe sepsis: secondary to pneumonia. See Dr. Helayne Seminole note on how pt meets severe sepsis criteria. Severe sepsis resolved  Pneumonia: c/o dyspnea today. continue on IV rocephin, azithromycin, bronchodilators & encourage incentive spirometry. Tussionex prn    DKA: anion gap has closed. Resolved    DMII: HbA1c 11. Continue on glargine, SSI w/ accuchecks   Hypokalemia: potassium given    Metabolic acidosis: resolved    Hypophosphatemia: K phosphated ordered    Glaucoma: continue on latanoprost eyedrops         DVT prophylaxis: lovenox  Code Status: full  Family Communication: Disposition Plan: likely d/c back home. Therapy recs home health but pt declines home health   Level of care: Med-Surg  Status is: Inpatient Remains inpatient appropriate because: short of breath today again     Consultants:    Procedures:   Antimicrobials: azithromycin, rocephin    Subjective: Pt c/o shortness of breath & cough   Objective: Vitals:   04/17/23 1759 04/17/23 1954 04/18/23 0030 04/18/23 0445  BP: 117/60 (!) 114/56 (!) 122/58 (!) 117/50  Pulse: 75 76 75 71  Resp: 18 18 18    Temp: 98.9 F (37.2 C) 98.5 F (36.9 C) 98.4 F (36.9 C) 98.4 F (36.9 C)  TempSrc:  Oral    SpO2: 100% 99% 98% 99%  Weight:      Height:        Intake/Output Summary (Last 24 hours) at 04/18/2023 0747 Last data filed at 04/18/2023 0300 Gross per 24 hour  Intake 907.28 ml  Output 253 ml  Net 654.28 ml   Filed Weights    04/14/23 2003  Weight: 72.6 kg    Examination:  General exam: appears calm but uncomfortable   Respiratory system: severely breath sounds b/l  Cardiovascular system: S1 & S2+. No rubs or clicks  Gastrointestinal system: Abd is soft, NT, ND & hypoactive bowel sounds  Central nervous system: Alert and oriented. Moves all extremities  Psychiatry: Judgement and insight appears at baseline. Flat mood and affect     Data Reviewed: I have personally reviewed following labs and imaging studies  CBC: Recent Labs  Lab 04/14/23 2025 04/15/23 0711 04/16/23 0610  WBC 13.9* 11.7* 7.5  NEUTROABS 11.7* 9.6* 5.4  HGB 14.7 13.9 12.1  HCT 47.7* 44.3 37.0  MCV 97.9 95.9 91.4  PLT 343 278 259   Basic Metabolic Panel: Recent Labs  Lab 04/15/23 0711 04/15/23 1200 04/16/23 0610 04/17/23 0540 04/18/23 0549  NA 135 134* 138 138 141  K 3.8 3.6 3.3* 3.6 3.0*  CL 110 108 108 106 100  CO2 14* 15* 22 26 35*  GLUCOSE 214* 212* 171* 215* 138*  BUN 24* 21 15 13 9   CREATININE 0.90 0.89 0.72 0.63 0.54  CALCIUM 8.3* 8.2* 8.5* 8.2* 8.1*  MG 1.9  --  2.1 2.4 2.2  PHOS 1.8*  --  1.3* 2.0* 1.8*   GFR: Estimated Creatinine Clearance: 63.9 mL/min (by C-G formula based on SCr of 0.54 mg/dL). Liver Function Tests: Recent Labs  Lab 04/14/23 2025 04/15/23 0711 04/16/23 0610  AST 11* 13*  --   ALT 9 8  --   ALKPHOS 97 81  --   BILITOT 1.7* 1.2  --   PROT 8.2* 7.4  --   ALBUMIN 3.7 3.3* 2.8*   Recent Labs  Lab 04/14/23 2025  LIPASE 26   No results for input(s): "AMMONIA" in the last 168 hours. Coagulation Profile: No results for input(s): "INR", "PROTIME" in the last 168 hours. Cardiac Enzymes: No results for input(s): "CKTOTAL", "CKMB", "CKMBINDEX", "TROPONINI" in the last 168 hours. BNP (last 3 results) No results for input(s): "PROBNP" in the last 8760 hours. HbA1C: No results for input(s): "HGBA1C" in the last 72 hours.  CBG: Recent Labs  Lab 04/16/23 2006 04/17/23 0827  04/17/23 1156 04/17/23 1550 04/17/23 2020  GLUCAP 168* 186* 194* 214* 106*   Lipid Profile: No results for input(s): "CHOL", "HDL", "LDLCALC", "TRIG", "CHOLHDL", "LDLDIRECT" in the last 72 hours. Thyroid Function Tests: No results for input(s): "TSH", "T4TOTAL", "FREET4", "T3FREE", "THYROIDAB" in the last 72 hours. Anemia Panel: No results for input(s): "VITAMINB12", "FOLATE", "FERRITIN", "TIBC", "IRON", "RETICCTPCT" in the last 72 hours. Sepsis Labs: Recent Labs  Lab 04/14/23 2025 04/14/23 2256 04/15/23 0424 04/15/23 0711  LATICACIDVEN 1.7 3.0* 1.6 0.9    Recent Results (from the past 240 hour(s))  Blood culture (single)     Status: None (Preliminary result)   Collection Time: 04/14/23  8:25 PM   Specimen: BLOOD  Result Value Ref Range Status   Specimen Description BLOOD BLOOD RIGHT HAND  Final   Special Requests   Final    BOTTLES DRAWN AEROBIC AND ANAEROBIC Blood Culture results may not be optimal due to an inadequate volume of blood received in culture bottles   Culture   Final    NO GROWTH 4 DAYS Performed at [facility], [address] [place], [place] [address]    Report Status PENDING  Incomplete  Resp panel by RT-PCR (RSV, Flu A&B, Covid) Anterior Nasal Swab     Status: None   Collection Time: 04/15/23  1:45 AM   Specimen: Anterior Nasal Swab  Result Value Ref Range Status   SARS Coronavirus 2 by RT PCR NEGATIVE NEGATIVE Final    Comment: (NOTE) SARS-CoV-2 target nucleic acids are NOT DETECTED.  The SARS-CoV-2 RNA is generally detectable in upper respiratory specimens during the acute phase of infection. The lowest concentration of SARS-CoV-2 viral copies this assay can detect is 138 copies/mL. A negative result does not preclude SARS-Cov-2 infection and should not be used as the sole basis for treatment or other patient management decisions. A negative result may occur with  improper specimen collection/handling, submission of specimen  other than nasopharyngeal swab, presence of viral mutation(s) within the areas targeted by this assay, and inadequate number of viral copies(<138 copies/mL). A negative result must be combined with clinical observations, patient history, and epidemiological information. The expected result is Negative.  Fact Sheet for Patients:  BloggerCourse.com  Fact Sheet for Healthcare Providers:  SeriousBroker.it  This test is no t yet approved or cleared by the [place] FDA and  has been authorized for detection and/or diagnosis of SARS-CoV-2 by FDA under an Emergency Use Authorization (EUA). This EUA will remain  in effect (meaning this test can be used) for the duration of the COVID-19 declaration under Section 564(b)(1) of the Act, 21 U.S.C.section 360bbb-3(b)(1), unless the authorization is terminated  or revoked sooner.  Influenza A by PCR NEGATIVE NEGATIVE Final   Influenza B by PCR NEGATIVE NEGATIVE Final    Comment: (NOTE) The Xpert Xpress SARS-CoV-2/FLU/RSV plus assay is intended as an aid in the diagnosis of influenza from Nasopharyngeal swab specimens and should not be used as a sole basis for treatment. Nasal washings and aspirates are unacceptable for Xpert Xpress SARS-CoV-2/FLU/RSV testing.  Fact Sheet for Patients: BloggerCourse.com  Fact Sheet for Healthcare Providers: SeriousBroker.it  This test is not yet approved or cleared by the [place] FDA and has been authorized for detection and/or diagnosis of SARS-CoV-2 by FDA under an Emergency Use Authorization (EUA). This EUA will remain in effect (meaning this test can be used) for the duration of the COVID-19 declaration under Section 564(b)(1) of the Act, 21 U.S.C. section 360bbb-3(b)(1), unless the authorization is terminated or revoked.     Resp Syncytial Virus by PCR NEGATIVE NEGATIVE Final     Comment: (NOTE) Fact Sheet for Patients: BloggerCourse.com  Fact Sheet for Healthcare Providers: SeriousBroker.it  This test is not yet approved or cleared by the [place] FDA and has been authorized for detection and/or diagnosis of SARS-CoV-2 by FDA under an Emergency Use Authorization (EUA). This EUA will remain in effect (meaning this test can be used) for the duration of the COVID-19 declaration under Section 564(b)(1) of the Act, 21 U.S.C. section 360bbb-3(b)(1), unless the authorization is terminated or revoked.  Performed at [facility], [address] [place], [place] [address]   MRSA Next Gen by PCR, Nasal     Status: None   Collection Time: 04/15/23  1:45 AM   Specimen: Nasal Mucosa; Nasal Swab  Result Value Ref Range Status   MRSA by PCR Next Gen NOT DETECTED NOT DETECTED Final    Comment: (NOTE) The GeneXpert MRSA Assay (FDA approved for NASAL specimens only), is one component of a comprehensive MRSA colonization surveillance program. It is not intended to diagnose MRSA infection nor to guide or monitor treatment for MRSA infections. Test performance is not FDA approved in patients less than 25 years old. Performed at [facility], [address] [place], [place] [address]          Radiology Studies: [facility] Chest [place] 1 View  Result Date: 04/17/2023 CLINICAL DATA:  [id number] Dyspnea [id number] EXAM: PORTABLE CHEST 1 VIEW COMPARISON:  Apr 14, 2023 FINDINGS: The cardiomediastinal silhouette is unchanged in contour.Atherosclerotic calcifications. No pleural effusion. No pneumothorax. Mildly improved aeration of the RIGHT lung base. Mild diffuse bronchitic markings and peribronchial cuffing which could reflect underlying small airways or reactive airways disease. No new focal consolidation. IMPRESSION: Mildly improved aeration of the RIGHT lung base. Electronically Signed   By: Meda Klinefelter M.D.    On: 04/17/2023 18:19        Scheduled Meds:  azithromycin  500 mg Oral Daily   docusate sodium  200 mg Oral BID   enoxaparin (LOVENOX) injection  40 mg Subcutaneous QHS   insulin aspart  0-5 Units Subcutaneous QHS   insulin aspart  0-9 Units Subcutaneous TID WC   insulin glargine-yfgn  20 Units Subcutaneous Daily   latanoprost  1 drop Right Eye QHS   levothyroxine  112 mcg Oral Once per day on Mon Tue Wed Thu Fri   And   [START ON 04/19/2023] levothyroxine  100 mcg Oral Once per day on Sun Sat   pantoprazole  40 mg Oral BID   potassium chloride  40 mEq Oral BID   senna  1 tablet Oral Daily   Continuous Infusions:  cefTRIAXone (ROCEPHIN)  IV 2 g (04/18/23 0600)     LOS: 4 days    Time spent: 30 mins     Charise Killian, MD [facility] Pager [phone]  If 7PM-7AM, please contact night-coverage [email or web address] 04/18/2023, 7:47 AM

## 2023-04-19 DIAGNOSIS — E1169 Type 2 diabetes mellitus with other specified complication: Secondary | ICD-10-CM | POA: Diagnosis not present

## 2023-04-19 DIAGNOSIS — J189 Pneumonia, unspecified organism: Secondary | ICD-10-CM | POA: Diagnosis not present

## 2023-04-19 DIAGNOSIS — Z794 Long term (current) use of insulin: Secondary | ICD-10-CM

## 2023-04-19 LAB — BASIC METABOLIC PANEL
Anion gap: 4 — ABNORMAL LOW (ref 5–15)
BUN: 10 mg/dL (ref 8–23)
CO2: 35 mmol/L — ABNORMAL HIGH (ref 22–32)
Calcium: 8.3 mg/dL — ABNORMAL LOW (ref 8.9–10.3)
Chloride: 100 mmol/L (ref 98–111)
Creatinine, Ser: 0.66 mg/dL (ref 0.44–1.00)
GFR, Estimated: >60 mL/min (ref >60)
Glucose, Bld: 209 mg/dL — ABNORMAL HIGH (ref 70–99)
Potassium: 4.6 mmol/L (ref 3.5–5.1)
Sodium: 139 mmol/L (ref 135–145)

## 2023-04-19 LAB — CBC
HCT: 37.6 % (ref 36.0–46.0)
Hemoglobin: 12.2 g/dL (ref 12.0–15.0)
MCH: 30 pg (ref 26.0–34.0)
MCHC: 32.4 g/dL (ref 30.0–36.0)
MCV: 92.6 fL (ref 80.0–100.0)
Platelets: 276 10*3/uL (ref 150–400)
RBC: 4.06 MIL/uL (ref 3.87–5.11)
RDW: 12.9 % (ref 11.5–15.5)
WBC: 9 10*3/uL (ref 4.0–10.5)
nRBC: 0 % (ref 0.0–0.2)

## 2023-04-19 LAB — CULTURE, BLOOD (SINGLE): Culture: NO GROWTH

## 2023-04-19 LAB — MAGNESIUM: Magnesium: 2.3 mg/dL (ref 1.7–2.4)

## 2023-04-19 LAB — PHOSPHORUS: Phosphorus: 1.8 mg/dL — ABNORMAL LOW (ref 2.5–4.6)

## 2023-04-19 LAB — GLUCOSE, CAPILLARY
Glucose-Capillary: 236 mg/dL — ABNORMAL HIGH (ref 70–99)
Glucose-Capillary: 281 mg/dL — ABNORMAL HIGH (ref 70–99)

## 2023-04-19 MED ORDER — PREDNISONE 20 MG PO TABS: 40.0000 mg | ORAL_TABLET | Freq: Every day | ORAL | 0 refills | Status: AC

## 2023-04-19 MED ORDER — ALBUTEROL SULFATE (2.5 MG/3ML) 0.083% IN NEBU: 2.5000 mg | INHALATION_SOLUTION | Freq: Two times a day (BID) | RESPIRATORY_TRACT | Status: DC

## 2023-04-19 MED ORDER — COMBIVENT RESPIMAT 20-100 MCG/ACT IN AERS: 1.0000 | INHALATION_SPRAY | RESPIRATORY_TRACT | 0 refills | Status: AC | PRN

## 2023-04-19 MED ORDER — AZITHROMYCIN 250 MG PO TABS: 250.0000 mg | ORAL_TABLET | Freq: Every day | ORAL | 0 refills | Status: AC

## 2023-04-19 MED ORDER — SODIUM PHOSPHATES 45 MMOLE/15ML IV SOLN
30.0000 mmol | Freq: Once | INTRAVENOUS | Status: AC
  Administered 2023-04-19: 30 mmol via INTRAVENOUS
  Filled 2023-04-19: qty 10

## 2023-04-19 MED ORDER — GUAIFENESIN ER 600 MG PO TB12: 600.0000 mg | ORAL_TABLET | Freq: Two times a day (BID) | ORAL | 0 refills | Status: AC | PRN

## 2023-04-19 NOTE — Progress Notes (Signed)
Patient oxygen saturation with oxygen 2 lit at rest;100%. Patient oxygen saturation in room air : 98%. Patient oxygen saturation in room air while ambulation in a room : 95%.

## 2023-04-19 NOTE — Discharge Summary (Signed)
Physician Discharge Summary  Jasmine Velasquez:[id number] DOB: 1951-08-13 DOA: 04/14/2023  PCP: Marisue Ivan, MD  Admit date: 04/14/2023 Discharge date: 04/19/2023  Admitted From: home Disposition:  home   Recommendations for Outpatient Follow-up:  Follow up with PCP in 1-2 weeks   Home Health: Pt refused home health  Equipment/Devices:  Discharge Condition: stable  CODE STATUS: full Diet recommendation: Heart Healthy / Carb Modified   Brief/Interim Summary: HPI was taken from Dr. Irena Cords: Jasmine Velasquez is an 72 y.o. female brought to the emergency room with complaints of nausea vomiting of blood glucose reading high. Patient has a history of poorly controlled diabetes mellitus that is insulin-dependent but has never had a history of DKA in the past. Patient states that for the past few days about a week she has had nausea and vomiting and has not been able to eat anything or keep anything down. Discussed with patient that probably from the vomiting she has aspirated and now also pneumonia that we will be treating. Patient is extremely weak and thirsty and dry appearing.   As per Dr. Mayford Knife 5/15-5/18/24: Pt was found to have DKA and started on insulin drip. Anion gap closed and pt was transitioned to sq insulin. Of note, pt was also found to have pneumonia and was treated w/ IV rocephin, azithromycin, bronchodilators & supplemental oxygen. Pt was able to be weaned off of supplemental oxygen prior to d/c. PT/OT evaluated the pt and recommended home health but pt refused home health.   Discharge Diagnoses:  Principal Problem:   DKA (diabetic ketoacidosis) (HCC) Active Problems:   SOB (shortness of breath)   Acquired hypothyroidism   Type 2 diabetes mellitus with hyperglycemia, with long-term current use of insulin (HCC)   CAP (community acquired pneumonia)   Glaucoma   Severe sepsis (HCC)   Hypophosphatemia  Severe sepsis: secondary to pneumonia. See Dr. Helayne Seminole  note on how pt meets severe sepsis criteria. Severe sepsis resolved  Pneumonia: respiratory status improved. Continue on IV rocephin, azithromycin, bronchodilators & encourage incentive spirometry. Tussionex prn    DKA: anion gap has closed. Resolved    DMII: HbA1c 11. Continue on glargine, SSI w/ accuchecks   Hypokalemia: WNL today    Metabolic acidosis: resolved    Hypophosphatemia: sodium phosphate given    Glaucoma: continue on latanoprost eyedrops   Discharge Instructions  Discharge Instructions     Diet - low sodium heart healthy   Complete by: As directed    Diet Carb Modified   Complete by: As directed    Discharge instructions   Complete by: As directed    F/u w/ PCP in 1-2 weeks   Increase activity slowly   Complete by: As directed       Allergies as of 04/19/2023       Reactions   Penicillins Rash        Medication List     TAKE these medications    azithromycin 250 MG tablet Commonly known as: Zithromax Take 1 tablet (250 mg total) by mouth daily for 3 days.   Combivent Respimat 20-100 MCG/ACT Aers respimat Generic drug: Ipratropium-Albuterol Inhale 1 puff into the lungs every 4 (four) hours as needed for wheezing or shortness of breath.   glipiZIDE 10 MG 24 hr tablet Commonly known as: GLUCOTROL XL Take 10 mg by mouth daily with breakfast.   guaiFENesin 600 MG 12 hr tablet Commonly known as: MUCINEX Take 1 tablet (600 mg total) by mouth 2 (two) times  daily as needed for up to 7 days for cough.   insulin NPH-regular Human (70-30) 100 UNIT/ML injection Inject 10-20 Units into the skin 2 (two) times daily with a meal. 20 units with breakfast and 10 with supper   Januvia 50 MG tablet Generic drug: sitaGLIPtin Take 50 mg by mouth daily.   levothyroxine 100 MCG tablet Commonly known as: SYNTHROID Take 100 tablets by mouth as directed. Take 1 tablet (100 mcg total) by mouth as directed (Saturday and Sunday) ON AN EMPTY STOMACH WITH A GLASS OF  WATER AT LEAST 30-60 MINUTES BEFORE BREAKFAST   levothyroxine 112 MCG tablet Commonly known as: SYNTHROID Take 112 mcg by mouth as directed. Take 1 tablet (112 mcg total) by mouth as directed (MONDAY THROUGH FRIDAY) ON AN EMPTY STOMACH WITH A GLASS OF WATER AT LEAST 30-60 MINUTES BEFORE BREAKFAST   metFORMIN 1000 MG tablet Commonly known as: GLUCOPHAGE Take 1,000 mg by mouth daily.   predniSONE 20 MG tablet Commonly known as: DELTASONE Take 2 tablets (40 mg total) by mouth daily for 5 days.        Allergies  Allergen Reactions   Penicillins Rash    Consultations:   Procedures/Studies: DG Chest Port 1 View  Result Date: 04/17/2023 CLINICAL DATA:  [id number] Dyspnea [id number] EXAM: PORTABLE CHEST 1 VIEW COMPARISON:  Apr 14, 2023 FINDINGS: The cardiomediastinal silhouette is unchanged in contour.Atherosclerotic calcifications. No pleural effusion. No pneumothorax. Mildly improved aeration of the RIGHT lung base. Mild diffuse bronchitic markings and peribronchial cuffing which could reflect underlying small airways or reactive airways disease. No new focal consolidation. IMPRESSION: Mildly improved aeration of the RIGHT lung base. Electronically Signed   By: Meda Klinefelter M.D.   On: 04/17/2023 18:19   CT HEAD WO CONTRAST ( )  Result Date: 04/14/2023 CLINICAL DATA:  Mental status change, unknown cause EXAM: CT HEAD WITHOUT CONTRAST TECHNIQUE: Contiguous axial images were obtained from the base of the skull through the vertex without intravenous contrast. RADIATION DOSE REDUCTION: This exam was performed according to the departmental dose-optimization program which includes automated exposure control, adjustment of the mA and/or kV according to patient size and/or use of iterative reconstruction technique. COMPARISON:  None Available. FINDINGS: Brain: No evidence of large-territorial acute infarction. No parenchymal hemorrhage. No mass lesion. No extra-axial collection. No mass effect or  midline shift. No hydrocephalus. Basilar cisterns are patent. Vascular: No hyperdense vessel. Skull: No acute fracture or focal lesion. Sinuses/Orbits: Paranasal sinuses and mastoid air cells are clear. The orbits are unremarkable. Other: None. IMPRESSION: No acute intracranial abnormality. Electronically Signed   By: Tish Frederickson M.D.   On: 04/14/2023 22:16   CT ABDOMEN PELVIS W CONTRAST  Result Date: 04/14/2023 CLINICAL DATA:  Abdominal pain, acute, nonlocalized EXAM: CT ABDOMEN AND PELVIS WITH CONTRAST TECHNIQUE: Multidetector CT imaging of the abdomen and pelvis was performed using the standard protocol following bolus administration of intravenous contrast. RADIATION DOSE REDUCTION: This exam was performed according to the departmental dose-optimization program which includes automated exposure control, adjustment of the mA and/or kV according to patient size and/or use of iterative reconstruction technique. CONTRAST:  OMNIPAQUE IOHEXOL 300 MG/ML  SOLN COMPARISON:  Chest x-ray 04/14/2023 FINDINGS: Lower chest: Bibasilar vague tree-in-bud nodularity. Hepatobiliary: No focal liver abnormality. Status post cholecystectomy. No biliary dilatation. Pancreas: Diffusely atrophic. No focal lesion. Otherwise normal pancreatic contour. No surrounding inflammatory changes. No main pancreatic ductal dilatation. Spleen: Normal in size without focal abnormality. Adrenals/Urinary Tract: No adrenal nodule bilaterally. Bilateral kidneys enhance symmetrically.  No hydronephrosis. No hydroureter. The urinary bladder is unremarkable. On delayed imaging, there is no urothelial wall thickening and there are no filling defects in the opacified portions of the bilateral collecting systems or ureters. Stomach/Bowel: Stomach is within normal limits. No evidence of bowel wall thickening or dilatation. The appendix is not definitely identified with no inflammatory changes in the right lower quadrant to suggest acute  appendicitis. Vascular/Lymphatic: No abdominal aorta or iliac aneurysm. Mild to moderate atherosclerotic plaque of the aorta and its branches. No abdominal, pelvic, or inguinal lymphadenopathy. Reproductive: Status post hysterectomy. No adnexal masses. Other: No intraperitoneal free fluid. No intraperitoneal free gas. No organized fluid collection. Musculoskeletal: Tiny fat containing umbilical hernia. No suspicious lytic or blastic osseous lesions. No acute displaced fracture. IMPRESSION: 1. Bibasilar vague tree-in-bud nodularity. Findings suggestive of atypical infection. No follow-up needed if patient is low-risk (and has no known or suspected primary neoplasm). Non-contrast chest CT can be considered in 12 months if patient is high-risk. This recommendation follows the consensus statement: Guidelines for Management of Incidental Pulmonary Nodules Detected on CT Images: From the Fleischner Society 2017; Radiology 2017; 284:228-243. 2. No acute intra-abdominal or intrapelvic abnormality. 3. Aortic Atherosclerosis (ICD10-I70.0). Electronically Signed   By: Tish Frederickson M.D.   On: 04/14/2023 22:02   DG Chest Portable 1 View  Result Date: 04/14/2023 CLINICAL DATA:  Shortness of breath EXAM: PORTABLE CHEST 1 VIEW COMPARISON:  11/28/2011 FINDINGS: Cardiac shadow is within normal limits. Aortic calcifications are seen. Lungs are well aerated bilaterally. Mild increased opacity is noted in the right lung base consistent with developing infiltrate. No bony abnormality is noted. IMPRESSION: Increasing airspace opacity in the right base. Electronically Signed   By: Alcide Clever M.D.   On: 04/14/2023 21:49   (Echo, Carotid, EGD, Colonoscopy, ERCP)    Subjective: Pt c/o malaise    Discharge Exam: Vitals:   04/19/23 1051 04/19/23 1218  BP:  (!) 127/46  Pulse:  90  Resp:  16  Temp:  98 F (36.7 C)  SpO2: 95% 94%   Vitals:   04/19/23 0802 04/19/23 1008 04/19/23 1051 04/19/23 1218  BP: 126/61   (!)  127/46  Pulse: 86   90  Resp: 16   16  Temp: 98.1 F (36.7 C)   98 F (36.7 C)  TempSrc: Oral   Oral  SpO2: 100% 100% 95% 94%  Weight:      Height:        General: Pt is alert, awake, not in acute distress Cardiovascular: S1/S2 +, no rubs, no gallops Respiratory: diminished breath sounds b/l  Abdominal: Soft, NT, ND, bowel sounds + Extremities: no edema, no cyanosis    The results of significant diagnostics from this hospitalization (including imaging, microbiology, ancillary and laboratory) are listed below for reference.     Microbiology: Recent Results (from the past 240 hour(s))  Blood culture (single)     Status: None   Collection Time: 04/14/23  8:25 PM   Specimen: BLOOD  Result Value Ref Range Status   Specimen Description BLOOD BLOOD RIGHT HAND  Final   Special Requests   Final    BOTTLES DRAWN AEROBIC AND ANAEROBIC Blood Culture results may not be optimal due to an inadequate volume of blood received in culture bottles   Culture   Final    NO GROWTH 5 DAYS Performed at [facility], [address], [place], [place] [address]    Report Status 04/19/2023 FINAL  Final  Resp panel by  RT-PCR (RSV, Flu A&B, Covid) Anterior Nasal Swab     Status: None   Collection Time: 04/15/23  1:45 AM   Specimen: Anterior Nasal Swab  Result Value Ref Range Status   SARS Coronavirus 2 by RT PCR NEGATIVE NEGATIVE Final    Comment: (NOTE) SARS-CoV-2 target nucleic acids are NOT DETECTED.  The SARS-CoV-2 RNA is generally detectable in upper respiratory specimens during the acute phase of infection. The lowest concentration of SARS-CoV-2 viral copies this assay can detect is 138 copies/mL. A negative result does not preclude SARS-Cov-2 infection and should not be used as the sole basis for treatment or other patient management decisions. A negative result may occur with  improper specimen collection/handling, submission of specimen other than nasopharyngeal swab, presence  of viral mutation(s) within the areas targeted by this assay, and inadequate number of viral copies(<138 copies/mL). A negative result must be combined with clinical observations, patient history, and epidemiological information. The expected result is Negative.  Fact Sheet for Patients:  BloggerCourse.com  Fact Sheet for Healthcare Providers:  SeriousBroker.it  This test is no t yet approved or cleared by the [place] FDA and  has been authorized for detection and/or diagnosis of SARS-CoV-2 by FDA under an Emergency Use Authorization (EUA). This EUA will remain  in effect (meaning this test can be used) for the duration of the COVID-19 declaration under Section 564(b)(1) of the Act, 21 U.S.C.section 360bbb-3(b)(1), unless the authorization is terminated  or revoked sooner.       Influenza A by PCR NEGATIVE NEGATIVE Final   Influenza B by PCR NEGATIVE NEGATIVE Final    Comment: (NOTE) The Xpert Xpress SARS-CoV-2/FLU/RSV plus assay is intended as an aid in the diagnosis of influenza from Nasopharyngeal swab specimens and should not be used as a sole basis for treatment. Nasal washings and aspirates are unacceptable for Xpert Xpress SARS-CoV-2/FLU/RSV testing.  Fact Sheet for Patients: BloggerCourse.com  Fact Sheet for Healthcare Providers: SeriousBroker.it  This test is not yet approved or cleared by the [place] FDA and has been authorized for detection and/or diagnosis of SARS-CoV-2 by FDA under an Emergency Use Authorization (EUA). This EUA will remain in effect (meaning this test can be used) for the duration of the COVID-19 declaration under Section 564(b)(1) of the Act, 21 U.S.C. section 360bbb-3(b)(1), unless the authorization is terminated or revoked.     Resp Syncytial Virus by PCR NEGATIVE NEGATIVE Final    Comment: (NOTE) Fact Sheet for  Patients: BloggerCourse.com  Fact Sheet for Healthcare Providers: SeriousBroker.it  This test is not yet approved or cleared by the [place] FDA and has been authorized for detection and/or diagnosis of SARS-CoV-2 by FDA under an Emergency Use Authorization (EUA). This EUA will remain in effect (meaning this test can be used) for the duration of the COVID-19 declaration under Section 564(b)(1) of the Act, 21 U.S.C. section 360bbb-3(b)(1), unless the authorization is terminated or revoked.  Performed at [facility], [address] [place], [place] [address]   MRSA Next Gen by PCR, Nasal     Status: None   Collection Time: 04/15/23  1:45 AM   Specimen: Nasal Mucosa; Nasal Swab  Result Value Ref Range Status   MRSA by PCR Next Gen NOT DETECTED NOT DETECTED Final    Comment: (NOTE) The GeneXpert MRSA Assay (FDA approved for NASAL specimens only), is one component of a comprehensive MRSA colonization surveillance program. It is not intended to diagnose MRSA infection nor to guide or monitor  treatment for MRSA infections. Test performance is not FDA approved in patients less than 55 years old. Performed at [facility], [address] [place], [place] [address]      Labs: BNP (last 3 results) No results for input(s): "BNP" in the last 8760 hours. Basic Metabolic Panel: Recent Labs  Lab 04/15/23 0711 04/15/23 1200 04/16/23 0610 04/17/23 0540 04/18/23 0549 04/19/23 0403  NA 135 134* 138 138 141 139  K 3.8 3.6 3.3* 3.6 3.0* 4.6  CL 110 108 108 106 100 100  CO2 14* 15* 22 26 35* 35*  GLUCOSE 214* 212* 171* 215* 138* 209*  BUN 24* 21 15 13 9 10   CREATININE 0.90 0.89 0.72 0.63 0.54 0.66  CALCIUM 8.3* 8.2* 8.5* 8.2* 8.1* 8.3*  MG 1.9  --  2.1 2.4 2.2 2.3  PHOS 1.8*  --  1.3* 2.0* 1.8* 1.8*   Liver Function Tests: Recent Labs  Lab 04/14/23 2025 04/15/23 0711 04/16/23 0610  AST 11* 13*  --    ALT 9 8  --   ALKPHOS 97 81  --   BILITOT 1.7* 1.2  --   PROT 8.2* 7.4  --   ALBUMIN 3.7 3.3* 2.8*   Recent Labs  Lab 04/14/23 2025  LIPASE 26   No results for input(s): "AMMONIA" in the last 168 hours. CBC: Recent Labs  Lab 04/14/23 2025 04/15/23 0711 04/16/23 0610 04/19/23 0403  WBC 13.9* 11.7* 7.5 9.0  NEUTROABS 11.7* 9.6* 5.4  --   HGB 14.7 13.9 12.1 12.2  HCT 47.7* 44.3 37.0 37.6  MCV 97.9 95.9 91.4 92.6  PLT 343 278 259 276   Cardiac Enzymes: No results for input(s): "CKTOTAL", "CKMB", "CKMBINDEX", "TROPONINI" in the last 168 hours. BNP: Invalid input(s): "POCBNP" CBG: Recent Labs  Lab 04/18/23 1226 04/18/23 1648 04/18/23 2022 04/19/23 0803 04/19/23 1215  GLUCAP 247* 386* 338* 236* 281*   D-Dimer No results for input(s): "DDIMER" in the last 72 hours. Hgb A1c No results for input(s): "HGBA1C" in the last 72 hours. Lipid Profile No results for input(s): "CHOL", "HDL", "LDLCALC", "TRIG", "CHOLHDL", "LDLDIRECT" in the last 72 hours. Thyroid function studies No results for input(s): "TSH", "T4TOTAL", "T3FREE", "THYROIDAB" in the last 72 hours.  Invalid input(s): "FREET3" Anemia work up No results for input(s): "VITAMINB12", "FOLATE", "FERRITIN", "TIBC", "IRON", "RETICCTPCT" in the last 72 hours. Urinalysis    Component Value Date/Time   COLORURINE YELLOW 04/14/2023 2239   APPEARANCEUR CLEAR 04/14/2023 2239   LABSPEC 1.020 04/14/2023 2239   PHURINE 5.0 04/14/2023 2239   GLUCOSEU 500 (A) 04/14/2023 2239   HGBUR TRACE (A) 04/14/2023 2239   BILIRUBINUR NEGATIVE 04/14/2023 2239   KETONESUR >160 (A) 04/14/2023 2239   PROTEINUR 30 (A) 04/14/2023 2239   NITRITE NEGATIVE 04/14/2023 2239   LEUKOCYTESUR NEGATIVE 04/14/2023 2239   Sepsis Labs Recent Labs  Lab 04/14/23 2025 04/15/23 0711 04/16/23 0610 04/19/23 0403  WBC 13.9* 11.7* 7.5 9.0   Microbiology Recent Results (from the past 240 hour(s))  Blood culture (single)     Status: None    Collection Time: 04/14/23  8:25 PM   Specimen: BLOOD  Result Value Ref Range Status   Specimen Description BLOOD BLOOD RIGHT HAND  Final   Special Requests   Final    BOTTLES DRAWN AEROBIC AND ANAEROBIC Blood Culture results may not be optimal due to an inadequate volume of blood received in culture bottles   Culture   Final    NO GROWTH 5 DAYS Performed  at [facility], [address] [place], [place] [address]    Report Status 04/19/2023 FINAL  Final  Resp panel by RT-PCR (RSV, Flu A&B, Covid) Anterior Nasal Swab     Status: None   Collection Time: 04/15/23  1:45 AM   Specimen: Anterior Nasal Swab  Result Value Ref Range Status   SARS Coronavirus 2 by RT PCR NEGATIVE NEGATIVE Final    Comment: (NOTE) SARS-CoV-2 target nucleic acids are NOT DETECTED.  The SARS-CoV-2 RNA is generally detectable in upper respiratory specimens during the acute phase of infection. The lowest concentration of SARS-CoV-2 viral copies this assay can detect is 138 copies/mL. A negative result does not preclude SARS-Cov-2 infection and should not be used as the sole basis for treatment or other patient management decisions. A negative result may occur with  improper specimen collection/handling, submission of specimen other than nasopharyngeal swab, presence of viral mutation(s) within the areas targeted by this assay, and inadequate number of viral copies(<138 copies/mL). A negative result must be combined with clinical observations, patient history, and epidemiological information. The expected result is Negative.  Fact Sheet for Patients:  BloggerCourse.com  Fact Sheet for Healthcare Providers:  SeriousBroker.it  This test is no t yet approved or cleared by the [place] FDA and  has been authorized for detection and/or diagnosis of SARS-CoV-2 by FDA under an Emergency Use Authorization (EUA). This EUA will remain  in effect (meaning  this test can be used) for the duration of the COVID-19 declaration under Section 564(b)(1) of the Act, 21 U.S.C.section 360bbb-3(b)(1), unless the authorization is terminated  or revoked sooner.       Influenza A by PCR NEGATIVE NEGATIVE Final   Influenza B by PCR NEGATIVE NEGATIVE Final    Comment: (NOTE) The Xpert Xpress SARS-CoV-2/FLU/RSV plus assay is intended as an aid in the diagnosis of influenza from Nasopharyngeal swab specimens and should not be used as a sole basis for treatment. Nasal washings and aspirates are unacceptable for Xpert Xpress SARS-CoV-2/FLU/RSV testing.  Fact Sheet for Patients: BloggerCourse.com  Fact Sheet for Healthcare Providers: SeriousBroker.it  This test is not yet approved or cleared by the [place] FDA and has been authorized for detection and/or diagnosis of SARS-CoV-2 by FDA under an Emergency Use Authorization (EUA). This EUA will remain in effect (meaning this test can be used) for the duration of the COVID-19 declaration under Section 564(b)(1) of the Act, 21 U.S.C. section 360bbb-3(b)(1), unless the authorization is terminated or revoked.     Resp Syncytial Virus by PCR NEGATIVE NEGATIVE Final    Comment: (NOTE) Fact Sheet for Patients: BloggerCourse.com  Fact Sheet for Healthcare Providers: SeriousBroker.it  This test is not yet approved or cleared by the [place] FDA and has been authorized for detection and/or diagnosis of SARS-CoV-2 by FDA under an Emergency Use Authorization (EUA). This EUA will remain in effect (meaning this test can be used) for the duration of the COVID-19 declaration under Section 564(b)(1) of the Act, 21 U.S.C. section 360bbb-3(b)(1), unless the authorization is terminated or revoked.  Performed at [facility], [address] [place], [place] [address]   MRSA Next Gen by PCR,  Nasal     Status: None   Collection Time: 04/15/23  1:45 AM   Specimen: Nasal Mucosa; Nasal Swab  Result Value Ref Range Status   MRSA by PCR Next Gen NOT DETECTED NOT DETECTED Final    Comment: (NOTE) The GeneXpert MRSA Assay (FDA approved for NASAL specimens  only), is one component of a comprehensive MRSA colonization surveillance program. It is not intended to diagnose MRSA infection nor to guide or monitor treatment for MRSA infections. Test performance is not FDA approved in patients less than 83 years old. Performed at [facility], [address], [place], [place] [address]      Time coordinating discharge: Over 30 minutes  SIGNED:   Charise Killian, MD  [facility] 04/19/2023, 12:25 PM Pager   If 7PM-7AM, please contact night-coverage [email or web address]

## 2023-04-19 NOTE — TOC Transition Note (Signed)
Transition of Care [facility]) - CM/SW Discharge Note   Patient Details  Name: Jasmine Velasquez MRN: [id number] Date of Birth: 06/09/1951  [facility] CM/SW Contact:  Luvenia Redden, RN Phone Number:[phone] 04/19/2023, 12:11 PM   Clinical Narrative:    Spoke directly with pt concerning recommendations for [facility]. Pt declined indicating she has a niece next door who will be assistance with her ongoing care. Pt confirms she is able to obtain all her medications ([facility] in [address]) and has a walker if needed. Pt lives alone in a double-wide trailer with 3 step entering the home. Niece will be transporting pt home today. Pt feels safe with no other needs presented.  PCP: Dr. Burnadette Pop  TOC remains available to assist with any other needs prior to discharge today.   Final next level of care: Home/Self Care Barriers to Discharge: No Barriers Identified   Patient Goals and CMS Choice      Discharge Placement                      Patient and family notified of of transfer: 04/19/23 (spoke directly with pt)  Discharge Plan and Services Additional resources added to the After Visit Summary for                                       Social Determinants of Health (SDOH) Interventions SDOH Screenings   Food Insecurity: No Food Insecurity (04/15/2023)  Housing: Patient Unable To Answer (04/15/2023)  Transportation Needs: No Transportation Needs (04/15/2023)  Utilities: Not At Risk (04/15/2023)  Tobacco Use: High Risk (04/15/2023)     Readmission Risk Interventions     No data to display

## 2023-04-29 DIAGNOSIS — E119 Type 2 diabetes mellitus without complications: Secondary | ICD-10-CM | POA: Diagnosis not present

## 2023-04-29 DIAGNOSIS — Z794 Long term (current) use of insulin: Secondary | ICD-10-CM | POA: Diagnosis not present

## 2023-04-29 DIAGNOSIS — Z09 Encounter for follow-up examination after completed treatment for conditions other than malignant neoplasm: Secondary | ICD-10-CM | POA: Diagnosis not present

## 2023-06-25 DIAGNOSIS — Z794 Long term (current) use of insulin: Secondary | ICD-10-CM | POA: Diagnosis not present

## 2023-06-25 DIAGNOSIS — E1165 Type 2 diabetes mellitus with hyperglycemia: Secondary | ICD-10-CM | POA: Diagnosis not present

## 2023-06-25 DIAGNOSIS — E039 Hypothyroidism, unspecified: Secondary | ICD-10-CM | POA: Diagnosis not present

## 2023-08-07 DIAGNOSIS — Z794 Long term (current) use of insulin: Secondary | ICD-10-CM | POA: Diagnosis not present

## 2023-08-07 DIAGNOSIS — E785 Hyperlipidemia, unspecified: Secondary | ICD-10-CM | POA: Diagnosis not present

## 2023-08-07 DIAGNOSIS — E039 Hypothyroidism, unspecified: Secondary | ICD-10-CM | POA: Diagnosis not present

## 2023-08-07 DIAGNOSIS — E1169 Type 2 diabetes mellitus with other specified complication: Secondary | ICD-10-CM | POA: Diagnosis not present

## 2023-08-07 DIAGNOSIS — E1165 Type 2 diabetes mellitus with hyperglycemia: Secondary | ICD-10-CM | POA: Diagnosis not present

## 2023-08-07 DIAGNOSIS — E7800 Pure hypercholesterolemia, unspecified: Secondary | ICD-10-CM | POA: Diagnosis not present

## 2023-08-13 DIAGNOSIS — N760 Acute vaginitis: Secondary | ICD-10-CM | POA: Diagnosis not present

## 2023-08-13 DIAGNOSIS — E119 Type 2 diabetes mellitus without complications: Secondary | ICD-10-CM | POA: Diagnosis not present

## 2023-08-13 DIAGNOSIS — Z0000 Encounter for general adult medical examination without abnormal findings: Secondary | ICD-10-CM | POA: Diagnosis not present

## 2023-08-13 DIAGNOSIS — E785 Hyperlipidemia, unspecified: Secondary | ICD-10-CM | POA: Diagnosis not present

## 2023-08-13 DIAGNOSIS — Z1331 Encounter for screening for depression: Secondary | ICD-10-CM | POA: Diagnosis not present

## 2023-08-27 DIAGNOSIS — I10 Essential (primary) hypertension: Secondary | ICD-10-CM | POA: Diagnosis not present

## 2023-08-27 DIAGNOSIS — F331 Major depressive disorder, recurrent, moderate: Secondary | ICD-10-CM | POA: Diagnosis not present

## 2023-08-27 DIAGNOSIS — E1165 Type 2 diabetes mellitus with hyperglycemia: Secondary | ICD-10-CM | POA: Diagnosis not present

## 2023-08-27 DIAGNOSIS — T383X5A Adverse effect of insulin and oral hypoglycemic [antidiabetic] drugs, initial encounter: Secondary | ICD-10-CM | POA: Diagnosis not present

## 2023-08-27 DIAGNOSIS — Z794 Long term (current) use of insulin: Secondary | ICD-10-CM | POA: Diagnosis not present

## 2023-08-27 DIAGNOSIS — E160 Drug-induced hypoglycemia without coma: Secondary | ICD-10-CM | POA: Diagnosis not present

## 2023-12-22 DIAGNOSIS — E039 Hypothyroidism, unspecified: Secondary | ICD-10-CM | POA: Diagnosis not present

## 2023-12-22 DIAGNOSIS — E1165 Type 2 diabetes mellitus with hyperglycemia: Secondary | ICD-10-CM | POA: Diagnosis not present

## 2023-12-22 DIAGNOSIS — F331 Major depressive disorder, recurrent, moderate: Secondary | ICD-10-CM | POA: Diagnosis not present

## 2023-12-22 DIAGNOSIS — Z794 Long term (current) use of insulin: Secondary | ICD-10-CM | POA: Diagnosis not present

## 2023-12-22 DIAGNOSIS — B3731 Acute candidiasis of vulva and vagina: Secondary | ICD-10-CM | POA: Diagnosis not present

## 2023-12-22 DIAGNOSIS — Z23 Encounter for immunization: Secondary | ICD-10-CM | POA: Diagnosis not present

## 2024-02-18 DIAGNOSIS — F330 Major depressive disorder, recurrent, mild: Secondary | ICD-10-CM | POA: Diagnosis not present

## 2024-02-18 DIAGNOSIS — E7800 Pure hypercholesterolemia, unspecified: Secondary | ICD-10-CM | POA: Diagnosis not present

## 2024-02-18 DIAGNOSIS — E039 Hypothyroidism, unspecified: Secondary | ICD-10-CM | POA: Diagnosis not present

## 2024-02-18 DIAGNOSIS — I10 Essential (primary) hypertension: Secondary | ICD-10-CM | POA: Diagnosis not present

## 2024-03-11 DIAGNOSIS — F330 Major depressive disorder, recurrent, mild: Secondary | ICD-10-CM | POA: Diagnosis not present

## 2024-03-11 DIAGNOSIS — E1169 Type 2 diabetes mellitus with other specified complication: Secondary | ICD-10-CM | POA: Diagnosis not present

## 2024-03-11 DIAGNOSIS — E7800 Pure hypercholesterolemia, unspecified: Secondary | ICD-10-CM | POA: Diagnosis not present

## 2024-03-11 DIAGNOSIS — E785 Hyperlipidemia, unspecified: Secondary | ICD-10-CM | POA: Diagnosis not present

## 2024-03-11 DIAGNOSIS — E039 Hypothyroidism, unspecified: Secondary | ICD-10-CM | POA: Diagnosis not present

## 2024-03-11 DIAGNOSIS — I10 Essential (primary) hypertension: Secondary | ICD-10-CM | POA: Diagnosis not present

## 2024-04-15 DIAGNOSIS — E039 Hypothyroidism, unspecified: Secondary | ICD-10-CM | POA: Diagnosis not present

## 2024-04-15 DIAGNOSIS — E7800 Pure hypercholesterolemia, unspecified: Secondary | ICD-10-CM | POA: Diagnosis not present

## 2024-04-15 DIAGNOSIS — F330 Major depressive disorder, recurrent, mild: Secondary | ICD-10-CM | POA: Diagnosis not present

## 2024-04-15 DIAGNOSIS — F411 Generalized anxiety disorder: Secondary | ICD-10-CM | POA: Diagnosis not present

## 2024-04-15 DIAGNOSIS — I10 Essential (primary) hypertension: Secondary | ICD-10-CM | POA: Diagnosis not present

## 2024-07-28 DIAGNOSIS — E785 Hyperlipidemia, unspecified: Secondary | ICD-10-CM | POA: Diagnosis not present

## 2024-07-28 DIAGNOSIS — E1169 Type 2 diabetes mellitus with other specified complication: Secondary | ICD-10-CM | POA: Diagnosis not present

## 2024-07-28 DIAGNOSIS — E039 Hypothyroidism, unspecified: Secondary | ICD-10-CM | POA: Diagnosis not present

## 2024-07-28 DIAGNOSIS — Z794 Long term (current) use of insulin: Secondary | ICD-10-CM | POA: Diagnosis not present

## 2024-07-28 DIAGNOSIS — I10 Essential (primary) hypertension: Secondary | ICD-10-CM | POA: Diagnosis not present

## 2024-07-28 DIAGNOSIS — E1165 Type 2 diabetes mellitus with hyperglycemia: Secondary | ICD-10-CM | POA: Diagnosis not present

## 2024-07-28 DIAGNOSIS — E7800 Pure hypercholesterolemia, unspecified: Secondary | ICD-10-CM | POA: Diagnosis not present

## 2024-08-04 DIAGNOSIS — Z794 Long term (current) use of insulin: Secondary | ICD-10-CM | POA: Diagnosis not present

## 2024-08-04 DIAGNOSIS — R0789 Other chest pain: Secondary | ICD-10-CM | POA: Diagnosis not present

## 2024-08-04 DIAGNOSIS — I10 Essential (primary) hypertension: Secondary | ICD-10-CM | POA: Diagnosis not present

## 2024-08-04 DIAGNOSIS — F411 Generalized anxiety disorder: Secondary | ICD-10-CM | POA: Diagnosis not present

## 2024-08-04 DIAGNOSIS — F330 Major depressive disorder, recurrent, mild: Secondary | ICD-10-CM | POA: Diagnosis not present

## 2024-08-04 DIAGNOSIS — E7800 Pure hypercholesterolemia, unspecified: Secondary | ICD-10-CM | POA: Diagnosis not present

## 2024-08-04 DIAGNOSIS — E119 Type 2 diabetes mellitus without complications: Secondary | ICD-10-CM | POA: Diagnosis not present

## 2024-08-04 DIAGNOSIS — E039 Hypothyroidism, unspecified: Secondary | ICD-10-CM | POA: Diagnosis not present

## 2024-08-09 DIAGNOSIS — E1165 Type 2 diabetes mellitus with hyperglycemia: Secondary | ICD-10-CM | POA: Diagnosis not present

## 2024-08-09 DIAGNOSIS — Z794 Long term (current) use of insulin: Secondary | ICD-10-CM | POA: Diagnosis not present

## 2024-11-16 ENCOUNTER — Other Ambulatory Visit
Admission: RE | Admit: 2024-11-16 | Discharge: 2024-11-16 | Disposition: A | Source: Ambulatory Visit | Attending: Family Medicine | Admitting: Family Medicine

## 2024-11-16 DIAGNOSIS — R0609 Other forms of dyspnea: Secondary | ICD-10-CM | POA: Insufficient documentation

## 2024-11-16 LAB — D-DIMER, QUANTITATIVE: D-Dimer, Quant: 1.15 ug{FEU}/mL — ABNORMAL HIGH (ref 0.00–0.50)

## 2024-11-18 ENCOUNTER — Ambulatory Visit
Admission: RE | Admit: 2024-11-18 | Discharge: 2024-11-18 | Disposition: A | Source: Ambulatory Visit | Attending: Family Medicine | Admitting: Family Medicine

## 2024-11-18 ENCOUNTER — Other Ambulatory Visit: Payer: Self-pay | Admitting: Family Medicine

## 2024-11-18 DIAGNOSIS — R0609 Other forms of dyspnea: Secondary | ICD-10-CM

## 2024-11-18 DIAGNOSIS — R7989 Other specified abnormal findings of blood chemistry: Secondary | ICD-10-CM | POA: Diagnosis present

## 2024-11-18 MED ORDER — IOHEXOL 350 MG/ML SOLN
75.0000 mL | Freq: Once | INTRAVENOUS | Status: AC | PRN
  Administered 2024-11-18: 15:00:00 75 mL via INTRAVENOUS
